# Patient Record
Sex: Female | Born: 1980
Health system: Southern US, Community
[De-identification: ages and names within clinical notes are randomized; demographics above are authoritative.]

## PROBLEM LIST (undated history)

## (undated) DIAGNOSIS — I10 Essential (primary) hypertension: Secondary | ICD-10-CM

## (undated) DIAGNOSIS — E282 Polycystic ovarian syndrome: Secondary | ICD-10-CM

## (undated) DIAGNOSIS — D649 Anemia, unspecified: Secondary | ICD-10-CM

## (undated) DIAGNOSIS — E611 Iron deficiency: Secondary | ICD-10-CM

## (undated) HISTORY — DX: Anemia, unspecified: D64.9

## (undated) HISTORY — PX: TUBAL LIGATION: SHX77

## (undated) HISTORY — DX: Iron deficiency: E61.1

## (undated) HISTORY — PX: CHOLECYSTECTOMY: SHX55

---

## 2017-03-28 ENCOUNTER — Other Ambulatory Visit: Payer: Self-pay

## 2017-03-28 ENCOUNTER — Emergency Department (HOSPITAL_COMMUNITY)
Admission: EM | Admit: 2017-03-28 | Discharge: 2017-03-28 | Disposition: A | Discharge status: Home / Self Care | Payer: 59 | Attending: Emergency Medicine | Admitting: Emergency Medicine

## 2017-03-28 ENCOUNTER — Encounter (HOSPITAL_COMMUNITY): Payer: Self-pay | Admitting: Emergency Medicine

## 2017-03-28 DIAGNOSIS — R509 Fever, unspecified: Secondary | ICD-10-CM | POA: Insufficient documentation

## 2017-03-28 DIAGNOSIS — R1030 Lower abdominal pain, unspecified: Secondary | ICD-10-CM | POA: Insufficient documentation

## 2017-03-28 DIAGNOSIS — I10 Essential (primary) hypertension: Secondary | ICD-10-CM | POA: Diagnosis not present

## 2017-03-28 DIAGNOSIS — R52 Pain, unspecified: Secondary | ICD-10-CM

## 2017-03-28 DIAGNOSIS — Z79899 Other long term (current) drug therapy: Secondary | ICD-10-CM | POA: Diagnosis not present

## 2017-03-28 HISTORY — DX: Essential (primary) hypertension: I10

## 2017-03-28 LAB — COMPREHENSIVE METABOLIC PANEL
ALBUMIN: 4.2 g/dL (ref 3.5–5.0)
ALK PHOS: 64 U/L (ref 38–126)
ALT: 20 U/L (ref 14–54)
AST: 31 U/L (ref 15–41)
Anion gap: 8 (ref 5–15)
BILIRUBIN TOTAL: 1 mg/dL (ref 0.3–1.2)
BUN: 8 mg/dL (ref 6–20)
CALCIUM: 8.6 mg/dL — AB (ref 8.9–10.3)
CO2: 22 mmol/L (ref 22–32)
CREATININE: 0.96 mg/dL (ref 0.44–1.00)
Chloride: 104 mmol/L (ref 101–111)
GFR calc Af Amer: >60 mL/min (ref >60)
GFR calc non Af Amer: >60 mL/min (ref >60)
GLUCOSE: 125 mg/dL — AB (ref 65–99)
Potassium: 3.6 mmol/L (ref 3.5–5.1)
SODIUM: 134 mmol/L — AB (ref 135–145)
TOTAL PROTEIN: 7.7 g/dL (ref 6.5–8.1)

## 2017-03-28 LAB — LIPASE, BLOOD: Lipase: 25 U/L (ref 11–51)

## 2017-03-28 LAB — WET PREP, GENITAL
Clue Cells Wet Prep HPF POC: NONE SEEN
Sperm: NONE SEEN
Trich, Wet Prep: NONE SEEN
Yeast Wet Prep HPF POC: NONE SEEN

## 2017-03-28 LAB — INFLUENZA PANEL BY PCR (TYPE A & B)
Influenza A By PCR: NEGATIVE
Influenza B By PCR: NEGATIVE

## 2017-03-28 LAB — CBC
HCT: 38.3 % (ref 36.0–46.0)
Hemoglobin: 13 g/dL (ref 12.0–15.0)
MCH: 27.1 pg (ref 26.0–34.0)
MCHC: 33.9 g/dL (ref 30.0–36.0)
MCV: 79.8 fL (ref 78.0–100.0)
PLATELETS: 265 10*3/uL (ref 150–400)
RBC: 4.8 MIL/uL (ref 3.87–5.11)
RDW: 14.8 % (ref 11.5–15.5)
WBC: 17.5 10*3/uL — ABNORMAL HIGH (ref 4.0–10.5)

## 2017-03-28 LAB — URINALYSIS, ROUTINE W REFLEX MICROSCOPIC
Bacteria, UA: NONE SEEN
Bilirubin Urine: NEGATIVE
GLUCOSE, UA: NEGATIVE mg/dL
Hgb urine dipstick: NEGATIVE
Ketones, ur: NEGATIVE mg/dL
Leukocytes, UA: NEGATIVE
NITRITE: NEGATIVE
PH: 5 (ref 5.0–8.0)
PROTEIN: 30 mg/dL — AB
Specific Gravity, Urine: 1.019 (ref 1.005–1.030)

## 2017-03-28 LAB — I-STAT BETA HCG BLOOD, ED (MC, WL, AP ONLY): I-stat hCG, quantitative: <5 m[IU]/mL (ref <5)

## 2017-03-28 LAB — I-STAT CG4 LACTIC ACID, ED
LACTIC ACID, VENOUS: 3.24 mmol/L — AB (ref 0.5–1.9)
LACTIC ACID, VENOUS: 1.41 mmol/L (ref 0.5–1.9)

## 2017-03-28 MED ORDER — DEXTROSE 5 % IV SOLN
1.0000 g | Freq: Once | INTRAVENOUS | Status: AC
  Administered 2017-03-28: 1 g via INTRAVENOUS
  Filled 2017-03-28: qty 10

## 2017-03-28 MED ORDER — KETOROLAC TROMETHAMINE 30 MG/ML IJ SOLN
30.0000 mg | Freq: Once | INTRAMUSCULAR | Status: AC
  Administered 2017-03-28: 30 mg via INTRAVENOUS
  Filled 2017-03-28: qty 1

## 2017-03-28 MED ORDER — HYDRALAZINE HCL 20 MG/ML IJ SOLN
5.0000 mg | Freq: Once | INTRAMUSCULAR | Status: AC
  Administered 2017-03-28: 5 mg via INTRAVENOUS
  Filled 2017-03-28: qty 1

## 2017-03-28 MED ORDER — ACETAMINOPHEN 325 MG PO TABS
650.0000 mg | ORAL_TABLET | Freq: Once | ORAL | Status: AC
  Administered 2017-03-28: 650 mg via ORAL
  Filled 2017-03-28: qty 2

## 2017-03-28 MED ORDER — SODIUM CHLORIDE 0.9 % IV BOLUS (SEPSIS)
1000.0000 mL | Freq: Once | INTRAVENOUS | Status: AC
  Administered 2017-03-28: 1000 mL via INTRAVENOUS

## 2017-03-28 MED ORDER — ACETAMINOPHEN 325 MG PO TABS
650.0000 mg | ORAL_TABLET | Freq: Once | ORAL | Status: AC
Start: 2017-03-28 — End: 2017-03-28
  Administered 2017-03-28: 650 mg via ORAL
  Filled 2017-03-28: qty 2

## 2017-03-28 MED ORDER — SODIUM CHLORIDE 0.9 % IV SOLN
1000.0000 mL | INTRAVENOUS | Status: DC
  Administered 2017-03-28: 1000 mL via INTRAVENOUS

## 2017-03-28 MED ORDER — ONDANSETRON 4 MG PO TBDP
4.0000 mg | ORAL_TABLET | Freq: Once | ORAL | Status: AC | PRN
  Administered 2017-03-28: 4 mg via ORAL
  Filled 2017-03-28: qty 1

## 2017-03-28 MED ORDER — AZITHROMYCIN 250 MG PO TABS
1000.0000 mg | ORAL_TABLET | Freq: Once | ORAL | Status: AC
  Administered 2017-03-28: 1000 mg via ORAL
  Filled 2017-03-28: qty 4

## 2017-03-28 NOTE — Discharge Instructions (Signed)
1.  Finish the antibiotics as prescribed by your primary care doctor. 2.  You were given an IV dose of Rocephin and a dose of Zithromax in the emergency department. 3.  Take ibuprofen and Tylenol for fever and body aches. 4.  Continue taking her metoprolol as prescribed.  Resume taking your nifedipine as prescribed. 5.  See your family doctor on Monday.  Return to the emergency department if your symptoms are not improving, worsening or new concerning symptoms develop.

## 2017-03-28 NOTE — ED Notes (Signed)
Bed: WA19 Expected date:  Expected time:  Means of arrival:  Comments: Hold for triage 4 

## 2017-03-28 NOTE — ED Provider Notes (Signed)
[facility]-EMERGENCY DEPT Provider Note   CSN: [id number] Arrival date & time: 03/28/17  4098     History   Chief Complaint Chief Complaint  Patient presents with  . Abdominal Pain  . Chills  . Headache    HPI Annette Ford is a 37 y.o. female.  HPI Since first symptoms started Monday, 4 days ago.  Initially she was noting lower abdominal discomfort and pain.  It was central with aching quality.  She denied pain into her flanks.  Denies vaginal discharge.  No noted pain burning or urgency.  She then went on to feel more generally ill.  She reports she got achy and developed nausea.  She saw her medical doctor yesterday and was diagnosed with UTI.  She was given Bactrim of which she took 1 dose last night.  She reports she is gotten a lot more generalized symptoms now.  She reports her whole body hurts, both arms and both legs feel very painful and achy.  She reports she did vomit twice this morning.  She reports she now has a bad frontal headache.,denies nasal discharge, earache or sore throat, neck pain or stiffness.  No cough.  Patient has history of hypertension.  She took her medications this morning but missed doses yesterday.  She is also self discontinued nifedipine and triamterene hydrochlorothiazide a number of months ago because she reports they were making her blood pressure too low.  She reports when she has a frontal headache like this it is often due to her hypertension.  She reports her blood pressures have routinely now been running moderately elevated. Past Medical History:  Diagnosis Date  . Hypertension     There are no active problems to display for this patient.   Past Surgical History:  Procedure Laterality Date  . CESAREAN SECTION    . CHOLECYSTECTOMY      OB History    No data available       Home Medications    Prior to Admission medications   Medication Sig Start Date End Date Taking? Authorizing Provider    acetaminophen (TYLENOL) 500 MG tablet Take 1,000 mg by mouth every 8 (eight) hours as needed for mild pain or headache.   Yes [provider]  metoprolol tartrate (LOPRESSOR) 100 MG tablet Take 100 mg by mouth 2 (two) times daily.   Yes [provider]  sulfamethoxazole-trimethoprim (BACTRIM DS,SEPTRA DS) 800-160 MG tablet Take 1 tablet by mouth 2 (two) times daily. 03/27/17  Yes [provider]    Family History History reviewed. No pertinent family history.  Social History Social History   Tobacco Use  . Smoking status: Never Smoker  . Smokeless tobacco: Never Used  Substance Use Topics  . Alcohol use: No    Frequency: Never  . Drug use: No     Allergies   Patient has no known allergies.   Review of Systems Review of Systems 10 Systems reviewed and are negative for acute change except as noted in the HPI.   Physical Exam Updated Vital Signs BP (!) 161/104 (BP Location: Right Arm)   Pulse (!) 112   Temp 98.3 F (36.8 C) (Oral)   Resp 14   LMP 03/15/2017 (Exact Date)   SpO2 99%   Physical Exam  Constitutional: She is oriented to person, place, and time.  Patient is alert and nontoxic but mildly ill in appearance.  Mental status is clear.  No respiratory distress.  HENT:  Head: Normocephalic and  atraumatic.  Bilateral TMs normal.  Right TM somewhat obstructed by cerumen but visualized portion nonerythematous.  Posterior oropharynx is widely patent.  No erythema exudates or vesicles.  Eyes: EOM are normal. Pupils are equal, round, and reactive to light.  Conjunctivae are mildly injected bilaterally.  Neck: Neck supple.  No meningismus.  Cardiovascular: Normal rate, regular rhythm, normal heart sounds and intact distal pulses.  Pulmonary/Chest: Effort normal and breath sounds normal.  Abdominal: Soft. She exhibits no distension. There is tenderness.  Mild lower abdominal tenderness suprapubic into the left.  No guarding.  Genitourinary:   Genitourinary Comments: Normal external female genitalia.  Speculum examination mild amount of yellowish discharge from cervical os.  Slight friability.  Bimanual, patient denies significant tenderness to bimanual exam.  Musculoskeletal: Normal range of motion. She exhibits tenderness. She exhibits no edema.  No peripheral edema.  Extremities are well formed and symmetric.  Patient does endorse diffuse discomfort to palpation of any muscle bodies.  Neurological: She is alert and oriented to person, place, and time. No cranial nerve deficit. She exhibits normal muscle tone. Coordination normal.  Skin: Skin is warm and dry. No rash noted.  Psychiatric: She has a normal mood and affect.     ED Treatments / Results  Labs (all labs ordered are listed, but only abnormal results are displayed) Labs Reviewed  WET PREP, GENITAL - Abnormal; Notable for the following components:      Result Value   WBC, Wet Prep HPF POC MODERATE (*)    All other components within normal limits  COMPREHENSIVE METABOLIC PANEL - Abnormal; Notable for the following components:   Sodium 134 (*)    Glucose, Bld 125 (*)    Calcium 8.6 (*)    All other components within normal limits  CBC - Abnormal; Notable for the following components:   WBC 17.5 (*)    All other components within normal limits  URINALYSIS, ROUTINE W REFLEX MICROSCOPIC - Abnormal; Notable for the following components:   Protein, ur 30 (*)    Squamous Epithelial / LPF 0-5 (*)    All other components within normal limits  I-STAT CG4 LACTIC ACID, ED - Abnormal; Notable for the following components:   Lactic Acid, Venous 3.24 (*)    All other components within normal limits  LIPASE, BLOOD  INFLUENZA PANEL BY PCR (TYPE A & B)  I-STAT BETA HCG BLOOD, ED ([facility], [facility], [facility] ONLY)  I-STAT CG4 LACTIC ACID, ED  GC/CHLAMYDIA PROBE AMP ([place]) NOT AT [facility]    EKG  EKG Interpretation None       Radiology No results found.  Procedures Procedures  (including critical care time)  Medications Ordered in ED Medications  sodium chloride 0.9 % bolus 1,000 mL (0 mLs Intravenous Stopped 03/28/17 1247)    Followed by  0.9 %  sodium chloride infusion (1,000 mLs Intravenous New Bag/Given 03/28/17 1138)  cefTRIAXone (ROCEPHIN) 1 g in dextrose 5 % 50 mL IVPB (not administered)  azithromycin (ZITHROMAX) tablet 1,000 mg (not administered)  hydrALAZINE (APRESOLINE) injection 5 mg (not administered)  ondansetron (ZOFRAN-ODT) disintegrating tablet 4 mg (4 mg Oral Given 03/28/17 1012)  acetaminophen (TYLENOL) tablet 650 mg (650 mg Oral Given 03/28/17 1026)  ketorolac (TORADOL) 30 MG/ML injection 30 mg (30 mg Intravenous Given 03/28/17 1138)  sodium chloride 0.9 % bolus 1,000 mL (1,000 mLs Intravenous New Bag/Given 03/28/17 1420)  acetaminophen (TYLENOL) tablet 650 mg (650 mg Oral Given 03/28/17 1421)     Initial Impression / Assessment  and Plan / ED Course  I have reviewed the triage vital signs and the nursing notes.  Pertinent labs & imaging results that were available during my care of the patient were reviewed by me and considered in my medical decision making (see chart for details).      Final Clinical Impressions(s) / ED Diagnoses   Final diagnoses:  Fever, unspecified fever cause  Lower abdominal pain  Generalized pain  Essential hypertension   Patient presents with fever and leukocytosis.  Symptoms onset first with lower abdominal discomfort.  Patient was started on antibiotics yesterday by PCP for UTI.  Today's urinalysis test negative.  Patient's abdominal exam is for fairly mild nonlocalizing suprapubic discomfort.  Abdominal exam is nonsurgical.  She has developed generalized symptoms of myalgia in both upper and lower extremities as well as frontal headache.  Patient attributes a headache to her slightly elevated blood pressure.  She does not have meningismus or neck pain.  Low suspicion for meningitic process.  At this time, based on first  symptoms being pelvic in etiology will add IV dose of Rocephin and dose of Zithromax in the emergency department while awaiting cultures.  Patient will continue the Bactrim as prescribed by her PCP.  She is counseled to resume her nifedipine as prescribed.  She will take ibuprofen and acetaminophen for fever and pain control.  She is counseled to return if symptoms are not improving or any additional concerning symptoms develop. ED Discharge Orders    None       Arby Barrette, MD 03/28/17 [phone]

## 2017-03-28 NOTE — ED Triage Notes (Signed)
Pt c/o lower abdominal pain, headache and chills, pt states she was seen in MD's office yesterday, dx with UTI and given antibiotics. Pt states she has only taken one pill and rx bid. Pt also c/o nausea, denies diarrhea.

## 2017-03-31 LAB — GC/CHLAMYDIA PROBE AMP (~~LOC~~) NOT AT ARMC
CHLAMYDIA, DNA PROBE: NEGATIVE
Neisseria Gonorrhea: NEGATIVE

## 2017-07-01 ENCOUNTER — Emergency Department (HOSPITAL_BASED_OUTPATIENT_CLINIC_OR_DEPARTMENT_OTHER)
Admission: EM | Admit: 2017-07-01 | Discharge: 2017-07-01 | Disposition: A | Discharge status: Home / Self Care | Payer: No Typology Code available for payment source | Attending: Physician Assistant | Admitting: Physician Assistant

## 2017-07-01 ENCOUNTER — Encounter (HOSPITAL_BASED_OUTPATIENT_CLINIC_OR_DEPARTMENT_OTHER): Payer: Self-pay | Admitting: *Deleted

## 2017-07-01 ENCOUNTER — Other Ambulatory Visit: Payer: Self-pay

## 2017-07-01 DIAGNOSIS — Y939 Activity, unspecified: Secondary | ICD-10-CM | POA: Insufficient documentation

## 2017-07-01 DIAGNOSIS — Z79899 Other long term (current) drug therapy: Secondary | ICD-10-CM | POA: Diagnosis not present

## 2017-07-01 DIAGNOSIS — Y999 Unspecified external cause status: Secondary | ICD-10-CM | POA: Insufficient documentation

## 2017-07-01 DIAGNOSIS — S39012A Strain of muscle, fascia and tendon of lower back, initial encounter: Secondary | ICD-10-CM | POA: Diagnosis not present

## 2017-07-01 DIAGNOSIS — Y929 Unspecified place or not applicable: Secondary | ICD-10-CM | POA: Diagnosis not present

## 2017-07-01 DIAGNOSIS — S3992XA Unspecified injury of lower back, initial encounter: Secondary | ICD-10-CM | POA: Diagnosis present

## 2017-07-01 DIAGNOSIS — S161XXA Strain of muscle, fascia and tendon at neck level, initial encounter: Secondary | ICD-10-CM

## 2017-07-01 DIAGNOSIS — M62838 Other muscle spasm: Secondary | ICD-10-CM

## 2017-07-01 DIAGNOSIS — M542 Cervicalgia: Secondary | ICD-10-CM

## 2017-07-01 DIAGNOSIS — M545 Low back pain, unspecified: Secondary | ICD-10-CM

## 2017-07-01 DIAGNOSIS — I10 Essential (primary) hypertension: Secondary | ICD-10-CM | POA: Diagnosis not present

## 2017-07-01 LAB — PREGNANCY, URINE: Preg Test, Ur: NEGATIVE

## 2017-07-01 MED ORDER — NAPROXEN 500 MG PO TABS: 500.0000 mg | ORAL_TABLET | Freq: Two times a day (BID) | ORAL | 0 refills | Status: DC | PRN

## 2017-07-01 MED ORDER — CYCLOBENZAPRINE HCL 10 MG PO TABS: 10.0000 mg | ORAL_TABLET | Freq: Three times a day (TID) | ORAL | 0 refills | Status: DC | PRN

## 2017-07-01 MED FILL — CYCLOBENZAPRINE HCL 10 MG T: 10 | 5 days supply | Qty: 15 | Fill #0

## 2017-07-01 MED FILL — NAPROXEN 500 MG TABLET: 500 | 10 days supply | Qty: 20 | Fill #0
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
## 2017-07-01 NOTE — Discharge Instructions (Addendum)
Take naprosyn as directed for inflammation and pain (take on a schedule, twice daily with food, for the next 2-3 days, then as needed thereafter), with tylenol for breakthrough pain and flexeril for muscle relaxation. Do not drive or operate machinery with muscle relaxant use. Ice to areas of soreness for the next 24 hours and then may move to heat, no more than 20 minutes at a time every hour for each. Expect to be sore for the next few days and follow up with primary care physician for recheck of ongoing symptoms in the next 1-2 weeks. Return to ER for emergent changing or worsening of symptoms.

## 2017-07-01 NOTE — ED Notes (Signed)
NAD at this time. Pt is stable and going home.  
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
## 2017-07-02 ENCOUNTER — Emergency Department (HOSPITAL_BASED_OUTPATIENT_CLINIC_OR_DEPARTMENT_OTHER)
Admission: EM | Admit: 2017-07-02 | Discharge: 2017-07-02 | Disposition: A | Discharge status: Home / Self Care | Payer: No Typology Code available for payment source | Attending: Emergency Medicine | Admitting: Emergency Medicine

## 2017-07-02 ENCOUNTER — Other Ambulatory Visit: Payer: Self-pay

## 2017-07-02 ENCOUNTER — Encounter (HOSPITAL_BASED_OUTPATIENT_CLINIC_OR_DEPARTMENT_OTHER): Payer: Self-pay

## 2017-07-02 DIAGNOSIS — M25552 Pain in left hip: Secondary | ICD-10-CM | POA: Insufficient documentation

## 2017-07-02 DIAGNOSIS — I10 Essential (primary) hypertension: Secondary | ICD-10-CM | POA: Insufficient documentation

## 2017-07-02 DIAGNOSIS — Y92410 Unspecified street and highway as the place of occurrence of the external cause: Secondary | ICD-10-CM | POA: Diagnosis not present

## 2017-07-02 DIAGNOSIS — S3981XA Other specified injuries of abdomen, initial encounter: Secondary | ICD-10-CM | POA: Diagnosis not present

## 2017-07-02 DIAGNOSIS — M79652 Pain in left thigh: Secondary | ICD-10-CM | POA: Diagnosis not present

## 2017-07-02 DIAGNOSIS — Y998 Other external cause status: Secondary | ICD-10-CM | POA: Diagnosis not present

## 2017-07-02 DIAGNOSIS — Y9389 Activity, other specified: Secondary | ICD-10-CM | POA: Diagnosis not present

## 2017-07-02 DIAGNOSIS — Z79899 Other long term (current) drug therapy: Secondary | ICD-10-CM | POA: Insufficient documentation

## 2017-07-02 DIAGNOSIS — S3991XA Unspecified injury of abdomen, initial encounter: Secondary | ICD-10-CM | POA: Diagnosis present

## 2017-07-02 LAB — URINALYSIS, MICROSCOPIC (REFLEX)

## 2017-07-02 LAB — URINALYSIS, ROUTINE W REFLEX MICROSCOPIC
Bilirubin Urine: NEGATIVE
Glucose, UA: NEGATIVE mg/dL
HGB URINE DIPSTICK: NEGATIVE
KETONES UR: 15 mg/dL — AB
Nitrite: NEGATIVE
PROTEIN: 30 mg/dL — AB
Specific Gravity, Urine: >1.03 — ABNORMAL HIGH (ref 1.005–1.030)
pH: 6 (ref 5.0–8.0)

## 2017-07-02 LAB — PREGNANCY, URINE: PREG TEST UR: NEGATIVE
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
## 2017-07-02 NOTE — ED Notes (Addendum)
Patient states that she has not taken any flexeril today due to having to drive earlier today. States last dose of naproxen 1240 today with noted relief until 1600 today. Patient states ate while in waiting room;aware of NPO states until instructed otherwise.  NAD noted.
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
## 2017-07-04 ENCOUNTER — Emergency Department (HOSPITAL_BASED_OUTPATIENT_CLINIC_OR_DEPARTMENT_OTHER)
Admission: EM | Admit: 2017-07-04 | Discharge: 2017-07-04 | Disposition: A | Discharge status: Home / Self Care | Payer: 59 | Attending: Emergency Medicine | Admitting: Emergency Medicine

## 2017-07-04 ENCOUNTER — Other Ambulatory Visit: Payer: Self-pay

## 2017-07-04 ENCOUNTER — Encounter (HOSPITAL_BASED_OUTPATIENT_CLINIC_OR_DEPARTMENT_OTHER): Payer: Self-pay | Admitting: Emergency Medicine

## 2017-07-04 DIAGNOSIS — R112 Nausea with vomiting, unspecified: Secondary | ICD-10-CM | POA: Insufficient documentation

## 2017-07-04 DIAGNOSIS — Z9049 Acquired absence of other specified parts of digestive tract: Secondary | ICD-10-CM | POA: Diagnosis not present

## 2017-07-04 DIAGNOSIS — I10 Essential (primary) hypertension: Secondary | ICD-10-CM | POA: Diagnosis not present

## 2017-07-04 DIAGNOSIS — Z79899 Other long term (current) drug therapy: Secondary | ICD-10-CM | POA: Diagnosis not present

## 2017-07-04 HISTORY — DX: Polycystic ovarian syndrome: E28.2

## 2017-07-04 MED ORDER — ONDANSETRON 4 MG PO TBDP
4.0000 mg | ORAL_TABLET | Freq: Once | ORAL | Status: AC
  Administered 2017-07-04: 4 mg via ORAL

## 2017-07-04 MED ORDER — ONDANSETRON 4 MG PO TBDP: 4.0000 mg | ORAL_TABLET | Freq: Three times a day (TID) | ORAL | 0 refills | Status: DC | PRN

## 2017-07-04 MED ORDER — ONDANSETRON 4 MG PO TBDP
ORAL_TABLET | ORAL | Status: AC
  Administered 2017-07-04: 4 mg via ORAL
  Filled 2017-07-04: qty 1

## 2017-07-04 MED FILL — ONDANSETRON ODT 4 MG TABLET: 4 | 4 days supply | Qty: 12 | Fill #0
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
## 2017-07-04 NOTE — Discharge Instructions (Signed)
Return to the ED with any concerns including vomiting and not able to keep down liquids or your medications, abdominal pain especially if it localizes to the right lower abdomen, fever or chills, and decreased urine output, decreased level of alertness or lethargy, or any other alarming symptoms.  °

## 2018-03-05 DIAGNOSIS — E66811 Obesity, class 1: Secondary | ICD-10-CM | POA: Insufficient documentation

## 2018-03-05 DIAGNOSIS — O30009 Twin pregnancy, unspecified number of placenta and unspecified number of amniotic sacs, unspecified trimester: Secondary | ICD-10-CM | POA: Insufficient documentation

## 2018-03-05 DIAGNOSIS — Z8759 Personal history of other complications of pregnancy, childbirth and the puerperium: Secondary | ICD-10-CM | POA: Insufficient documentation

## 2018-03-05 DIAGNOSIS — E669 Obesity, unspecified: Secondary | ICD-10-CM | POA: Insufficient documentation

## 2018-04-08 DIAGNOSIS — B009 Herpesviral infection, unspecified: Secondary | ICD-10-CM | POA: Insufficient documentation

## 2018-08-28 DIAGNOSIS — Z98891 History of uterine scar from previous surgery: Secondary | ICD-10-CM | POA: Insufficient documentation

## 2018-12-22 ENCOUNTER — Encounter (HOSPITAL_COMMUNITY): Payer: Self-pay

## 2018-12-22 ENCOUNTER — Other Ambulatory Visit: Payer: Self-pay

## 2018-12-22 ENCOUNTER — Ambulatory Visit (HOSPITAL_COMMUNITY)
Admission: EM | Admit: 2018-12-22 | Discharge: 2018-12-22 | Disposition: A | Discharge status: Home / Self Care | Payer: Commercial Managed Care - PPO | Attending: Emergency Medicine | Admitting: Emergency Medicine

## 2018-12-22 DIAGNOSIS — I10 Essential (primary) hypertension: Secondary | ICD-10-CM | POA: Insufficient documentation

## 2018-12-22 DIAGNOSIS — R300 Dysuria: Secondary | ICD-10-CM

## 2018-12-22 LAB — POCT URINALYSIS DIP (DEVICE)
Bilirubin Urine: NEGATIVE
Glucose, UA: NEGATIVE mg/dL
Hgb urine dipstick: NEGATIVE
Ketones, ur: NEGATIVE mg/dL
Nitrite: NEGATIVE
Protein, ur: NEGATIVE mg/dL
Specific Gravity, Urine: 1.02 (ref 1.005–1.030)
Urobilinogen, UA: 0.2 mg/dL (ref 0.0–1.0)
pH: 6 (ref 5.0–8.0)

## 2018-12-22 MED ORDER — CEPHALEXIN 500 MG PO CAPS: 500.0000 mg | ORAL_CAPSULE | Freq: Two times a day (BID) | ORAL | 0 refills | Status: AC

## 2018-12-22 NOTE — ED Provider Notes (Signed)
[facility]    CSN: [id number] Arrival date & time: 12/22/18  1423      History   Chief Complaint Chief Complaint  Patient presents with  . Urinary Tract Infection    HPI Annette Ford is a 38 y.o. female.   Patient presents with dysuria, urinary frequency, left flank pain x1 week.  She denies fever, chills, abdominal pain, vaginal discharge, pelvic pain, or other symptoms.  Medical history is significant for hypertension and patient states she has not been taking her blood pressure medications.  She denies dizziness, palpitations, chest pain, shortness of breath, weakness, or other symptoms.  LMP: 12/18/2018  The history is provided by the patient.    Past Medical History:  Diagnosis Date  . Hypertension   . PCOS (polycystic ovarian syndrome)     There are no active problems to display for this patient.   Past Surgical History:  Procedure Laterality Date  . CESAREAN SECTION    . CHOLECYSTECTOMY      OB History   No obstetric history on file.      Home Medications    Prior to Admission medications   Medication Sig Start Date End Date Taking? Authorizing Provider  acetaminophen (TYLENOL) 500 MG tablet Take 1,000 mg by mouth every 8 (eight) hours as needed for mild pain or headache.    [provider]  cephALEXin (KEFLEX) 500 MG capsule Take 1 capsule (500 mg total) by mouth 2 (two) times daily for 5 days. 12/22/18 12/27/18  Mickie Bailate, Chong January H, NP  cyclobenzaprine (FLEXERIL) 10 MG tablet Take 1 tablet (10 mg total) by mouth 3 (three) times daily as needed for muscle spasms. 07/01/17   Street, [place], PA-C  metoprolol tartrate (LOPRESSOR) 100 MG tablet Take 100 mg by mouth 2 (two) times daily.    [provider]  naproxen (NAPROSYN) 500 MG tablet Take 1 tablet (500 mg total) by mouth 2 (two) times daily as needed for mild pain, moderate pain or headache (TAKE WITH MEALS.). 07/01/17   Street, [place], PA-C  NIFEDIPINE PO Take by mouth.     [provider]  ondansetron (ZOFRAN ODT) 4 MG disintegrating tablet Take 1 tablet (4 mg total) by mouth every 8 (eight) hours as needed for nausea or vomiting. 07/04/17   Mabe, Latanya MaudlinMartha L, MD    Family History No family history on file.  Social History Social History   Tobacco Use  . Smoking status: Never Smoker  . Smokeless tobacco: Never Used  Substance Use Topics  . Alcohol use: Yes    Frequency: Never  . Drug use: No     Allergies   Patient has no known allergies.   Review of Systems Review of Systems  Constitutional: Negative for chills and fever.  HENT: Negative for ear pain and sore throat.   Eyes: Negative for pain and visual disturbance.  Respiratory: Negative for cough and shortness of breath.   Cardiovascular: Negative for chest pain and palpitations.  Gastrointestinal: Negative for abdominal pain and vomiting.  Genitourinary: Positive for dysuria, flank pain and frequency. Negative for hematuria, pelvic pain and vaginal discharge.  Musculoskeletal: Negative for arthralgias and back pain.  Skin: Negative for color change and rash.  Neurological: Negative for seizures and syncope.  All other systems reviewed and are negative.    Physical Exam Triage Vital Signs ED Triage Vitals  Enc Vitals Group     BP 12/22/18 1443 (!) 192/117     Pulse Rate 12/22/18 1443 96  Resp 12/22/18 1443 18     Temp 12/22/18 1443 98.2 F (36.8 C)     Temp Source 12/22/18 1443 Oral     SpO2 12/22/18 1443 100 %     Weight 12/22/18 1441 180 lb (81.6 kg)     Height --      Head Circumference --      Peak Flow --      Pain Score 12/22/18 1441 5     Pain Loc --      Pain Edu? --      Excl. in [place]? --    No data found.  Updated Vital Signs BP (!) 173/104 (BP Location: Left Arm)   Pulse 85   Temp 98.2 F (36.8 C) (Oral)   Resp 18   Wt 180 lb (81.6 kg)   LMP 12/18/2018   SpO2 100%   BMI 29.95 kg/m   Visual Acuity Right Eye Distance:   Left Eye Distance:    Bilateral Distance:    Right Eye Near:   Left Eye Near:    Bilateral Near:     Physical Exam Vitals signs and nursing note reviewed.  Constitutional:      General: She is not in acute distress.    Appearance: She is well-developed.  HENT:     Head: Normocephalic and atraumatic.     Mouth/Throat:     Mouth: Mucous membranes are moist.  Eyes:     Conjunctiva/sclera: Conjunctivae normal.  Neck:     Musculoskeletal: Neck supple.  Cardiovascular:     Rate and Rhythm: Normal rate and regular rhythm.     Heart sounds: No murmur.  Pulmonary:     Effort: Pulmonary effort is normal. No respiratory distress.     Breath sounds: Normal breath sounds.  Abdominal:     General: Bowel sounds are normal.     Palpations: Abdomen is soft.     Tenderness: There is no abdominal tenderness. There is no right CVA tenderness, left CVA tenderness, guarding or rebound.  Skin:    General: Skin is warm and dry.  Neurological:     General: No focal deficit present.     Mental Status: She is alert and oriented to person, place, and time.      UC Treatments / Results  Labs (all labs ordered are listed, but only abnormal results are displayed) Labs Reviewed  POCT URINALYSIS DIP (DEVICE) - Abnormal; Notable for the following components:      Result Value   Leukocytes,Ua TRACE (*)    All other components within normal limits  URINE CULTURE    EKG   Radiology No results found.  Procedures Procedures (including critical care time)  Medications Ordered in UC Medications - No data to display  Initial Impression / Assessment and Plan / UC Course  I have reviewed the triage vital signs and the nursing notes.  Pertinent labs & imaging results that were available during my care of the patient were reviewed by me and considered in my medical decision making (see chart for details).    Dysuria.  Elevated blood pressure with known hypertension.  Urine dip positive for LE.  Urine culture  pending.  Discussed with patient that her blood pressure is elevated today.  She is noncompliant with her blood pressure medication.  Discussed that she should have this rechecked by her primary care provider in the next week and that she should take her medication as prescribed.  Discussed her risk for cardiovascular events  due to hypertension.  Patient agrees to plan of care.     Final Clinical Impressions(s) / UC Diagnoses   Final diagnoses:  Dysuria  Elevated blood pressure reading in office with diagnosis of hypertension     Discharge Instructions     Take the antibiotic cephalexin as prescribed.  Your urine shows signs of a possible infection.  A urine culture is pending.    Your blood pressure is elevated today at  192/117 and rechecked at 173/104.  Please have this rechecked by your primary care provider in one week.  Take your blood pressure medications as directed by your primary care provider.        ED Prescriptions    Medication Sig Dispense Auth. Provider   cephALEXin (KEFLEX) 500 MG capsule Take 1 capsule (500 mg total) by mouth 2 (two) times daily for 5 days. 10 capsule Mickie Bail, NP     PDMP not reviewed this encounter.   Mickie Bail, NP 12/22/18 [id number]

## 2018-12-22 NOTE — ED Triage Notes (Signed)
PT states the she has pressure when trying to void. X 1 week or more.

## 2018-12-22 NOTE — Discharge Instructions (Signed)
Take the antibiotic cephalexin as prescribed.  Your urine shows signs of a possible infection.  A urine culture is pending.    Your blood pressure is elevated today at  192/117 and rechecked at 173/104.  Please have this rechecked by your primary care provider in one week.  Take your blood pressure medications as directed by your primary care provider.

## 2018-12-25 ENCOUNTER — Telehealth (HOSPITAL_COMMUNITY): Payer: Self-pay | Admitting: Emergency Medicine

## 2018-12-25 LAB — URINE CULTURE: Culture: 10000 — AB

## 2018-12-25 NOTE — Telephone Encounter (Signed)
Only 10,000 colonies, pt was given keflex for treatment. Patient contacted and made aware of  Culture results, informed to please follow up if still having symptoms since colony count was low.

## 2019-03-24 ENCOUNTER — Emergency Department (HOSPITAL_COMMUNITY): Payer: Commercial Managed Care - PPO

## 2019-03-24 ENCOUNTER — Emergency Department (HOSPITAL_COMMUNITY)
Admission: EM | Admit: 2019-03-24 | Discharge: 2019-03-24 | Disposition: A | Discharge status: Home / Self Care | Payer: Commercial Managed Care - PPO | Attending: Emergency Medicine | Admitting: Emergency Medicine

## 2019-03-24 ENCOUNTER — Other Ambulatory Visit: Payer: Self-pay

## 2019-03-24 ENCOUNTER — Encounter (HOSPITAL_COMMUNITY): Payer: Self-pay | Admitting: Emergency Medicine

## 2019-03-24 DIAGNOSIS — Y999 Unspecified external cause status: Secondary | ICD-10-CM | POA: Insufficient documentation

## 2019-03-24 DIAGNOSIS — Y92009 Unspecified place in unspecified non-institutional (private) residence as the place of occurrence of the external cause: Secondary | ICD-10-CM | POA: Diagnosis not present

## 2019-03-24 DIAGNOSIS — Z79899 Other long term (current) drug therapy: Secondary | ICD-10-CM | POA: Diagnosis not present

## 2019-03-24 DIAGNOSIS — S61012A Laceration without foreign body of left thumb without damage to nail, initial encounter: Secondary | ICD-10-CM | POA: Diagnosis present

## 2019-03-24 DIAGNOSIS — I10 Essential (primary) hypertension: Secondary | ICD-10-CM | POA: Insufficient documentation

## 2019-03-24 DIAGNOSIS — W260XXA Contact with knife, initial encounter: Secondary | ICD-10-CM | POA: Diagnosis not present

## 2019-03-24 DIAGNOSIS — Y9389 Activity, other specified: Secondary | ICD-10-CM | POA: Insufficient documentation

## 2019-03-24 MED ORDER — LIDOCAINE HCL (PF) 1 % IJ SOLN
5.0000 mL | Freq: Once | INTRAMUSCULAR | Status: AC
  Administered 2019-03-24: 5 mL via INTRADERMAL
  Filled 2019-03-24: qty 5

## 2019-03-24 NOTE — ED Triage Notes (Signed)
Patient accidentally cut her left thumb with a knife while opening a toy this evening , approx. 1/2 " laceration with minimal bleeding , pressure dressing applied at triage .

## 2019-03-24 NOTE — ED Provider Notes (Signed)
[facility] EMERGENCY DEPARTMENT Provider Note   CSN: [id number] Arrival date & time: 03/24/19  2013     History Chief Complaint  Patient presents with  . Finger Laceration    Annette Ford is a 38 y.o. female.  The history is provided by the patient and medical records.    38 y.o. F with hx of HTN, PCOS, presenting to the ED for left thumb laceration.  States she was trying to open a toy for one of her children, knife slipped and cut left thumb.  States bleeding was controlled on arrival but started bleeding again once removed.  Tetanus UTD (May 2020).  Of note, patient is hypertensive.  Has not yet taken her PM BP medications.  She has them with her and will take now.  Past Medical History:  Diagnosis Date  . Hypertension   . PCOS (polycystic ovarian syndrome)     There are no problems to display for this patient.   Past Surgical History:  Procedure Laterality Date  . CESAREAN SECTION    . CHOLECYSTECTOMY       OB History   No obstetric history on file.     No family history on file.  Social History   Tobacco Use  . Smoking status: Never Smoker  . Smokeless tobacco: Never Used  Substance Use Topics  . Alcohol use: Yes  . Drug use: No    Home Medications Prior to Admission medications   Medication Sig Start Date End Date Taking? Authorizing Provider  acetaminophen (TYLENOL) 500 MG tablet Take 1,000 mg by mouth every 8 (eight) hours as needed for mild pain or headache.    [provider]  cyclobenzaprine (FLEXERIL) 10 MG tablet Take 1 tablet (10 mg total) by mouth 3 (three) times daily as needed for muscle spasms. 07/01/17   Street, [place], PA-C  metoprolol tartrate (LOPRESSOR) 100 MG tablet Take 100 mg by mouth 2 (two) times daily.    [provider]  naproxen (NAPROSYN) 500 MG tablet Take 1 tablet (500 mg total) by mouth 2 (two) times daily as needed for mild pain, moderate pain or headache (TAKE WITH  MEALS.). 07/01/17   Street, [place], PA-C  NIFEDIPINE PO Take by mouth.    [provider]  ondansetron (ZOFRAN ODT) 4 MG disintegrating tablet Take 1 tablet (4 mg total) by mouth every 8 (eight) hours as needed for nausea or vomiting. 07/04/17   Mabe, Latanya Maudlin, MD    Allergies    Patient has no known allergies.  Review of Systems   Review of Systems  Skin: Positive for wound.  All other systems reviewed and are negative.   Physical Exam Updated Vital Signs BP (!) 175/104 (BP Location: Right Arm)   Pulse 70   Temp 98.4 F (36.9 C) (Oral)   Resp 18   SpO2 100%   Physical Exam Vitals and nursing note reviewed.  Constitutional:      Appearance: She is well-developed.  HENT:     Head: Normocephalic and atraumatic.  Eyes:     Conjunctiva/sclera: Conjunctivae normal.     Pupils: Pupils are equal, round, and reactive to light.  Cardiovascular:     Rate and Rhythm: Normal rate and regular rhythm.     Heart sounds: Normal heart sounds.  Pulmonary:     Effort: Pulmonary effort is normal.     Breath sounds: Normal breath sounds.  Abdominal:     General: Bowel sounds are normal.  Palpations: Abdomen is soft.  Musculoskeletal:        General: Normal range of motion.     Cervical back: Normal range of motion.     Comments: 2cm laceration along medial left thumb, brisk oozing but no pulsatile bleeding noted, able to flex/extend, no bony deformities noted, good cap refill and distal sensation  Skin:    General: Skin is warm and dry.  Neurological:     Mental Status: She is alert and oriented to person, place, and time.     ED Results / Procedures / Treatments   Labs (all labs ordered are listed, but only abnormal results are displayed) Labs Reviewed - No data to display  EKG None  Radiology DG Finger Thumb Left  Result Date: 03/24/2019 CLINICAL DATA:  Laceration of the thumb. EXAM: LEFT THUMB 2+V COMPARISON:  None. FINDINGS: There is no evidence of fracture  or dislocation. There is no evidence of arthropathy or other focal bone abnormality. There is laceration of the first distal phalanx. IMPRESSION: No acute osseous injury of the first distal phalanx. Electronically Signed   By: Kathreen Devoid   On: 03/24/2019 21:22    Procedures Procedures (including critical care time)  LACERATION REPAIR Performed by: Larene Pickett Authorized by: Larene Pickett Consent: Verbal consent obtained. Risks and benefits: risks, benefits and alternatives were discussed Consent given by: patient Patient identity confirmed: provided demographic data Prepped and Draped in normal sterile fashion Wound explored  Laceration Location: medial left thumb  Laceration Length: 2cm  No Foreign Bodies seen or palpated  Anesthesia: local infiltration  Local anesthetic: lidocaine 1% without epinephrine  Anesthetic total: 3 ml  Irrigation method: syringe Amount of cleaning: standard  Skin closure: 4-0 prolene  Number of sutures: 2  Technique: simple interrupted  Patient tolerance: Patient tolerated the procedure well with no immediate complications.   Medications Ordered in ED Medications  lidocaine (PF) (XYLOCAINE) 1 % injection 5 mL (has no administration in time range)    ED Course  I have reviewed the triage vital signs and the nursing notes.  Pertinent labs & imaging results that were available during my care of the patient were reviewed by me and considered in my medical decision making (see chart for details).    MDM Rules/Calculators/A&P                      38 y.o. F here with left thumb laceration from knife while trying to open a toy for her kids.  2cm laceration to left medial thumb, no swelling or bony deformities.  Hand is NVI.  Tetanus UTD.  Laceration repaired as above, tolerated well.  she was initially hypertensive, after her PM home meds BP now 163/95..  Stable for discharge.  Discussed home wound care.  Follow-up with PCP for suture  removal in 1 week.  Return here for any new/acute changes.  Final Clinical Impression(s) / ED Diagnoses Final diagnoses:  Laceration of left thumb without foreign body without damage to nail, initial encounter    Rx / DC Orders ED Discharge Orders    None       Larene Pickett, PA-C 03/24/19 2347    Fatima Blank, MD 03/26/19 [phone]

## 2019-03-24 NOTE — Discharge Instructions (Signed)
Keep wound clean and dry. Follow-up with primary care doctor to have sutures removed in 1 week. Return here for any new/acute changes.

## 2019-06-28 ENCOUNTER — Other Ambulatory Visit: Payer: Self-pay | Admitting: Internal Medicine

## 2019-06-28 ENCOUNTER — Other Ambulatory Visit: Payer: Self-pay

## 2019-06-28 ENCOUNTER — Ambulatory Visit
Admission: RE | Admit: 2019-06-28 | Discharge: 2019-06-28 | Disposition: A | Discharge status: Home / Self Care | Payer: Commercial Managed Care - PPO | Source: Ambulatory Visit | Attending: Internal Medicine | Admitting: Internal Medicine

## 2019-06-28 DIAGNOSIS — M533 Sacrococcygeal disorders, not elsewhere classified: Secondary | ICD-10-CM

## 2020-03-07 ENCOUNTER — Other Ambulatory Visit: Payer: Self-pay

## 2020-03-07 ENCOUNTER — Ambulatory Visit (HOSPITAL_COMMUNITY)
Admission: EM | Admit: 2020-03-07 | Discharge: 2020-03-07 | Disposition: A | Discharge status: Home / Self Care | Payer: Commercial Managed Care - PPO | Attending: Family Medicine | Admitting: Family Medicine

## 2020-03-07 ENCOUNTER — Encounter (HOSPITAL_COMMUNITY): Payer: Self-pay | Admitting: Emergency Medicine

## 2020-03-07 DIAGNOSIS — I10 Essential (primary) hypertension: Secondary | ICD-10-CM

## 2020-03-07 MED ORDER — AMLODIPINE BESYLATE 5 MG PO TABS: 5.0000 mg | ORAL_TABLET | Freq: Every day | ORAL | 2 refills | Status: DC

## 2020-03-07 NOTE — ED Triage Notes (Signed)
Patient was typing and trying to write with right hand and noticed shaking in this hand.  Patient had not eaten, so she ate and shaking improved.  When company nurse returned, patient had her blood pressure checked.  Reports blood pressure 175/101.  No chest pain

## 2020-03-07 NOTE — ED Provider Notes (Signed)
[facility] CARE CENTER   [id number] 03/07/20 Arrival Time: 1439  ASSESSMENT & PLAN:  1. Uncontrolled hypertension     No symptoms of hypertensive urgency. Desires to begin additional anti-hypertensive agent.  Begin: Meds ordered this encounter  Medications  . amLODipine (NORVASC) 5 MG tablet    Sig: Take 1 tablet (5 mg total) by mouth daily.    Dispense:  30 tablet    Refill:  2   Has BP cuff at home to monitor pressures.  Recommend:  Follow-up Information    Schedule an appointment as soon as possible for a visit  with Aliene Beams, MD.   Specialty: Family Medicine Contact information: [address] [place] [address] [phone]        [facility].   Specialty: Urgent Care Why: As needed. Contact information: [address] [place] [address] [phone]              Reviewed expectations re: course of current medical issues. Questions answered. Outlined signs and symptoms indicating need for more acute intervention. Patient verbalized understanding. After Visit Summary given.   SUBJECTIVE:  Annette Ford is a 39 y.o. female who presents with concerns regarding increased blood pressures. Reports that she is treated for hypertension.  She reports taking medications as instructed, no medication side effects noted, no chest pain on exertion, no dyspnea on exertion, no swelling of ankles, no orthostatic dizziness or lightheadedness, no orthopnea or paroxysmal nocturnal dyspnea and no palpitations.  Denies symptoms of chest pain, palpations, orthopnea, nocturnal dyspnea, or LE edema.  Social History   Tobacco Use  Smoking Status Never Smoker  Smokeless Tobacco Never Used    OBJECTIVE:  Vitals:   03/07/20 1612 03/07/20 1614  BP: (!) 161/119 (!) 157/105  Pulse: 78   Resp: 20   Temp: 98.5 F (36.9 C)   TempSrc: Oral   SpO2: 97%     General appearance: alert; no distress Eyes:  PERRLA; EOMI HENT: normocephalic; atraumatic Neck: supple Lungs: clear to auscultation bilaterally Heart: regular rate and rhythm without murmer Skin: warm and dry Psychological: alert and cooperative; normal mood and affect   No Known Allergies  Past Medical History:  Diagnosis Date  . Hypertension   . PCOS (polycystic ovarian syndrome)    Social History   Socioeconomic History  . Marital status: Married    Spouse name: Not on file  . Number of children: Not on file  . Years of education: Not on file  . Highest education level: Not on file  Occupational History  . Not on file  Tobacco Use  . Smoking status: Never Smoker  . Smokeless tobacco: Never Used  Vaping Use  . Vaping Use: Never used  Substance and Sexual Activity  . Alcohol use: Yes  . Drug use: No  . Sexual activity: Not on file  Other Topics Concern  . Not on file  Social History Narrative  . Not on file   Social Determinants of Health   Financial Resource Strain: Not on file  Food Insecurity: Not on file  Transportation Needs: Not on file  Physical Activity: Not on file  Stress: Not on file  Social Connections: Not on file  Intimate Partner Violence: Not on file   No family history on file. Past Surgical History:  Procedure Laterality Date  . CESAREAN SECTION    . CHOLECYSTECTOMY    . TUBAL LIGATION        Mardella Layman, MD  03/07/20 1706  

## 2020-03-23 ENCOUNTER — Telehealth (HOSPITAL_COMMUNITY): Payer: Self-pay | Admitting: Urgent Care

## 2020-03-23 ENCOUNTER — Other Ambulatory Visit: Payer: Self-pay

## 2020-03-23 ENCOUNTER — Telehealth (HOSPITAL_COMMUNITY): Payer: Self-pay | Admitting: Emergency Medicine

## 2020-03-23 ENCOUNTER — Encounter (HOSPITAL_COMMUNITY): Payer: Self-pay | Admitting: Emergency Medicine

## 2020-03-23 ENCOUNTER — Ambulatory Visit (HOSPITAL_COMMUNITY)
Admission: EM | Admit: 2020-03-23 | Discharge: 2020-03-23 | Disposition: A | Discharge status: Home / Self Care | Payer: Commercial Managed Care - PPO | Attending: Urgent Care | Admitting: Urgent Care

## 2020-03-23 DIAGNOSIS — Z8616 Personal history of COVID-19: Secondary | ICD-10-CM | POA: Insufficient documentation

## 2020-03-23 DIAGNOSIS — R067 Sneezing: Secondary | ICD-10-CM | POA: Insufficient documentation

## 2020-03-23 DIAGNOSIS — B349 Viral infection, unspecified: Secondary | ICD-10-CM

## 2020-03-23 DIAGNOSIS — R059 Cough, unspecified: Secondary | ICD-10-CM | POA: Diagnosis present

## 2020-03-23 LAB — RESP PANEL BY RT-PCR (FLU A&B, COVID) ARPGX2
Influenza A by PCR: NEGATIVE
Influenza B by PCR: NEGATIVE
SARS Coronavirus 2 by RT PCR: NEGATIVE

## 2020-03-23 MED ORDER — PROMETHAZINE-DM 6.25-15 MG/5ML PO SYRP: 5.0000 mL | ORAL_SOLUTION | Freq: Every evening | ORAL | 0 refills | Status: DC | PRN

## 2020-03-23 MED ORDER — BENZONATATE 100 MG PO CAPS: 100.0000 mg | ORAL_CAPSULE | Freq: Three times a day (TID) | ORAL | 0 refills | Status: DC | PRN

## 2020-03-23 MED ORDER — CETIRIZINE HCL 10 MG PO TABS: 10.0000 mg | ORAL_TABLET | Freq: Every day | ORAL | 0 refills | Status: DC

## 2020-03-23 NOTE — Telephone Encounter (Signed)
Encounter opened in error

## 2020-03-23 NOTE — Telephone Encounter (Signed)
Patient called and requested a paper prescription.  This was done handed over to the patient.

## 2020-03-23 NOTE — ED Provider Notes (Signed)
[name]RG[facility]ARE [facility]   MRN: [id number] DOB: June 03, 1980  Subjective:   Annette Ford is a 39 y.o. female presenting for 5-day history of acute onset headache, coughing, sneezing.  Patient has a history of COVID-19 in August.  Has used over-the-counter Tylenol and Mucinex.  Has a history of hypertension and is on medication for this.  Denies chest pain, shortness of breath, body aches.  She is Covid vaccinated but not fully vaccinated.  No current facility-administered medications for this encounter.  Current Outpatient Medications:    amLODipine (NORVASC) 5 MG tablet, Take 1 tablet (5 mg total) by mouth daily., Disp: 30 tablet, Rfl: 2   atorvastatin (LIPITOR) 10 MG tablet, Take 10 mg by mouth daily., Disp: , Rfl:    metoprolol tartrate (LOPRESSOR) 100 MG tablet, Take 100 mg by mouth 2 (two) times daily., Disp: , Rfl:    No Known Allergies  Past Medical History:  Diagnosis Date   Hypertension    PCOS (polycystic ovarian syndrome)      Past Surgical History:  Procedure Laterality Date   CESAREAN SECTION     CHOLECYSTECTOMY     TUBAL LIGATION      History reviewed. No pertinent family history.  Social History   Tobacco Use   Smoking status: Never Smoker   Smokeless tobacco: Never Used  Vaping Use   Vaping Use: Never used  Substance Use Topics   Alcohol use: Yes   Drug use: No    ROS   Objective:   Vitals: BP (!) 150/99 (BP Location: Right Arm)    Pulse 69    Temp (!) 97.5 F (36.4 C) (Oral)    Resp 16    LMP 03/23/2020    SpO2 100%   BP Readings from Last 3 Encounters:  03/23/20 (!) 150/99  03/07/20 (!) 157/105  03/24/19 (!) 149/92   Physical Exam Constitutional:      General: She is not in acute distress.    Appearance: Normal appearance. She is well-developed. She is not ill-appearing, toxic-appearing or diaphoretic.  HENT:     Head: Normocephalic and atraumatic.     Nose: Nose normal.     Mouth/Throat:     Mouth:  Mucous membranes are moist.  Eyes:     Extraocular Movements: Extraocular movements intact.     Pupils: Pupils are equal, round, and reactive to light.  Cardiovascular:     Rate and Rhythm: Normal rate and regular rhythm.     Pulses: Normal pulses.     Heart sounds: Normal heart sounds. No murmur heard. No friction rub. No gallop.   Pulmonary:     Effort: Pulmonary effort is normal. No respiratory distress.     Breath sounds: Normal breath sounds. No stridor. No wheezing, rhonchi or rales.  Skin:    General: Skin is warm and dry.     Findings: No rash.  Neurological:     Mental Status: She is alert and oriented to person, place, and time.  Psychiatric:        Mood and Affect: Mood normal.        Behavior: Behavior normal.        Thought Content: Thought content normal.      Assessment and Plan :   PDMP not reviewed this encounter.  1. Viral syndrome   2. Cough   3. Sneezing   4. History of COVID-19     Will manage for viral illness such as viral URI, viral syndrome, viral  rhinitis, COVID-19. Counseled patient on nature of COVID-19 including modes of transmission, diagnostic testing, management and supportive care.  Offered scripts for symptomatic relief. COVID 19 testing is pending. Counseled patient on potential for adverse effects with medications prescribed/recommended today, ER and return-to-clinic precautions discussed, patient verbalized understanding.     Wallis Bamberg, PA-C 03/23/20 1143

## 2020-03-23 NOTE — ED Triage Notes (Signed)
PT C/O: cold sx onset 5 days associated w/headache, cough, sneezing  DENIES: f/n/v/d  TAKING MEDS: OTC Acetaminophen and Mucinex   A&O x4... NAD... Ambulatory

## 2020-03-23 NOTE — Telephone Encounter (Signed)
Patient reported having difficulty getting medicines from pharmacy ([facility]),  Requesting paper script.  Urban Gibson, pa provided paper script and left in file at patient access

## 2020-03-23 NOTE — Discharge Instructions (Addendum)

## 2020-08-03 ENCOUNTER — Encounter: Payer: Self-pay | Admitting: Emergency Medicine

## 2020-08-03 ENCOUNTER — Ambulatory Visit
Admission: EM | Admit: 2020-08-03 | Discharge: 2020-08-03 | Disposition: A | Discharge status: Home / Self Care | Payer: Commercial Managed Care - PPO | Attending: Emergency Medicine | Admitting: Emergency Medicine

## 2020-08-03 DIAGNOSIS — M79604 Pain in right leg: Secondary | ICD-10-CM | POA: Diagnosis not present

## 2020-08-03 DIAGNOSIS — R030 Elevated blood-pressure reading, without diagnosis of hypertension: Secondary | ICD-10-CM

## 2020-08-03 MED ORDER — PREDNISONE 20 MG PO TABS: 20.0000 mg | ORAL_TABLET | Freq: Two times a day (BID) | ORAL | 0 refills | Status: AC

## 2020-08-03 MED ORDER — CYCLOBENZAPRINE HCL 10 MG PO TABS: 10.0000 mg | ORAL_TABLET | Freq: Two times a day (BID) | ORAL | 0 refills | Status: DC | PRN

## 2020-08-03 NOTE — ED Triage Notes (Signed)
Pain in RT leg /hip area that started today.  Denies any injury.  Pt also reports bp is elevated.

## 2020-08-03 NOTE — ED Provider Notes (Addendum)
[facility]ARE [facility]ENTER   [id number] 08/03/20 Arrival Time: 1218  CC: Leg PAIN  SUBJECTIVE: History from: patient. Jonesha Tsuchiya is a 40 y.o. female complains of RT upper leg/ hip pain x this morning.  Denies a precipitating event or specific injury.  Localizes the pain to the outside of RT leg.  Describes the pain as intermittent and sharp in character.  Has tried OTC medications without relief.  Symptoms are made worse with movement.  Denies similar symptoms in the past.  Denies fever, chills, erythema, ecchymosis, effusion, weakness, numbness and tingling, saddle paresthesias, loss of bowel or bladder function.      ROS: As per HPI.  All other pertinent ROS negative.     Past Medical History:  Diagnosis Date  . Hypertension   . PCOS (polycystic ovarian syndrome)    Past Surgical History:  Procedure Laterality Date  . CESAREAN SECTION    . CHOLECYSTECTOMY    . TUBAL LIGATION     No Known Allergies No current facility-administered medications on file prior to encounter.   Current Outpatient Medications on File Prior to Encounter  Medication Sig Dispense Refill  . amLODipine (NORVASC) 5 MG tablet Take 1 tablet (5 mg total) by mouth daily. 30 tablet 2  . atorvastatin (LIPITOR) 10 MG tablet Take 10 mg by mouth daily.    . metoprolol tartrate (LOPRESSOR) 100 MG tablet Take 100 mg by mouth 2 (two) times daily.    . [DISCONTINUED] cetirizine (ZYRTEC ALLERGY) 10 MG tablet Take 1 tablet (10 mg total) by mouth daily. 30 tablet 0   Social History   Socioeconomic History  . Marital status: Married    Spouse name: Not on file  . Number of children: Not on file  . Years of education: Not on file  . Highest education level: Not on file  Occupational History  . Not on file  Tobacco Use  . Smoking status: Never Smoker  . Smokeless tobacco: Never Used  Vaping Use  . Vaping Use: Never used  Substance and Sexual Activity  . Alcohol use: Yes  . Drug use: No  . Sexual  activity: Not on file  Other Topics Concern  . Not on file  Social History Narrative  . Not on file   Social Determinants of Health   Financial Resource Strain: Not on file  Food Insecurity: Not on file  Transportation Needs: Not on file  Physical Activity: Not on file  Stress: Not on file  Social Connections: Not on file  Intimate Partner Violence: Not on file   No family history on file.  OBJECTIVE:  Vitals:   08/03/20 1245  BP: (!) 146/94  Pulse: 74  Resp: 18  Temp: 98.4 F (36.9 C)  TempSrc: Oral  SpO2: 97%    General appearance: ALERT; in no acute distress.  Head: NCAT Lungs: Normal respiratory effort Musculoskeletal: RT leg Inspection: Skin warm, dry, clear and intact without obvious erythema, effusion, or ecchymosis.  Palpation: diffusely TTP over lateral hip Strength: 5/5 hip flexion, 5/5 hip extension Skin: warm and dry Neurologic: Ambulates without difficulty Psychological: alert and cooperative; normal mood and affect  ASSESSMENT & PLAN:  1. Right leg pain   2. Elevated blood pressure reading     Meds ordered this encounter  Medications  . predniSONE (DELTASONE) 20 MG tablet    Sig: Take 1 tablet (20 mg total) by mouth 2 (two) times daily with a meal for 5 days.    Dispense:  10 tablet  Refill:  0    Order Specific Question:   Supervising Provider    Answer:   Eustace Moore [[id number]]  . cyclobenzaprine (FLEXERIL) 10 MG tablet    Sig: Take 1 tablet (10 mg total) by mouth 2 (two) times daily as needed for muscle spasms.    Dispense:  20 tablet    Refill:  0    Order Specific Question:   Supervising Provider    Answer:   Eustace Moore [[id number]]   Possible muscle strain/ spasm of low back vs. leg Continue conservative management of rest, ice, and gentle stretches Prednisone prescribed.  Take as directed and to completion Take cyclobenzaprine at nighttime for symptomatic relief. Avoid driving or operating heavy machinery while using  medication. Follow up with PCP if symptoms persist Return or go to the ER if you have any new or worsening symptoms (fever, chills, chest pain, abdominal pain, changes in bowel or bladder habits, pain radiating into lower legs, etc...)   Blood pressure elevated in office.  Please recheck in 24 hours.  If it continues to be greater than 140/90 please follow up with PCP for further evaluation and management.    Reviewed expectations re: course of current medical issues. Questions answered. Outlined signs and symptoms indicating need for more acute intervention. Patient verbalized understanding. After Visit Summary given.    Rennis Harding, PA-C 08/03/20 1334    Alvino Chapel Polo, PA-C 08/03/20 1335

## 2020-08-03 NOTE — Discharge Instructions (Addendum)
Continue conservative management of rest, ice, and gentle stretches Prednisone prescribed.  Take as directed and to completion Take cyclobenzaprine at nighttime for symptomatic relief. Avoid driving or operating heavy machinery while using medication. Follow up with PCP if symptoms persist Return or go to the ER if you have any new or worsening symptoms (fever, chills, chest pain, abdominal pain, changes in bowel or bladder habits, pain radiating into lower legs, etc...)   Blood pressure elevated in office.  Please recheck in 24 hours.  If it continues to be greater than 140/90 please follow up with PCP for further evaluation and management.

## 2020-08-30 ENCOUNTER — Ambulatory Visit (HOSPITAL_COMMUNITY): Payer: Self-pay

## 2020-08-30 ENCOUNTER — Ambulatory Visit (HOSPITAL_COMMUNITY)
Admission: EM | Admit: 2020-08-30 | Discharge: 2020-08-30 | Disposition: A | Discharge status: Home / Self Care | Payer: Commercial Managed Care - PPO | Attending: Family Medicine | Admitting: Family Medicine

## 2020-08-30 ENCOUNTER — Encounter (HOSPITAL_COMMUNITY): Payer: Self-pay | Admitting: Emergency Medicine

## 2020-08-30 ENCOUNTER — Other Ambulatory Visit: Payer: Self-pay

## 2020-08-30 DIAGNOSIS — I10 Essential (primary) hypertension: Secondary | ICD-10-CM | POA: Diagnosis not present

## 2020-08-30 MED ORDER — AMLODIPINE BESYLATE 5 MG PO TABS: 5.0000 mg | ORAL_TABLET | Freq: Every day | ORAL | 2 refills | Status: DC

## 2020-08-30 MED ORDER — METOPROLOL SUCCINATE ER 200 MG PO TB24
200.0000 mg | ORAL_TABLET | Freq: Every day | ORAL | 2 refills | Status: DC
Start: 2020-08-30 — End: 2022-02-06

## 2020-08-30 NOTE — ED Provider Notes (Signed)
  [facility] CARE CENTER   [id number] 08/30/20 Arrival Time: 1145  ASSESSMENT & PLAN:  1. Essential hypertension     Meds ordered this encounter  Medications  . amLODipine (NORVASC) 5 MG tablet    Sig: Take 1 tablet (5 mg total) by mouth daily.    Dispense:  30 tablet    Refill:  2  . metoprolol (TOPROL XL) 200 MG 24 hr tablet    Sig: Take 1 tablet (200 mg total) by mouth daily.    Dispense:  30 tablet    Refill:  2   She plans to schedule f/u with new PCP.  Reviewed expectations re: course of current medical issues. Questions answered. Outlined signs and symptoms indicating need for more acute intervention. Patient verbalized understanding. After Visit Summary given.   SUBJECTIVE:  Annette Ford is a 40 y.o. female who presents with concerns regarding increased blood pressures. Reports that she is treated for HTN. About to run out of meds.  She reports taking medications as instructed, no medication side effects noted, no chest pain on exertion, no dyspnea on exertion and no swelling of ankles.  Denies symptoms of chest pain, palpations, orthopnea, nocturnal dyspnea, or LE edema.  Social History   Tobacco Use  Smoking Status Never Smoker  Smokeless Tobacco Never Used     OBJECTIVE:  Vitals:   08/30/20 1153  BP: (!) 155/96  Pulse: 74  Resp: 18  Temp: 98.7 F (37.1 C)  TempSrc: Oral  SpO2: 100%    General appearance: alert; no distress Eyes: PERRLA; EOMI HENT: normocephalic; atraumatic Neck: supple Lungs: unlabored Abdomen: soft, non-tender; bowel sounds normal Extremities: no edema; symmetrical with no gross deformities Skin: warm and dry Psychological: alert and cooperative; normal mood and affect   No Known Allergies  Past Medical History:  Diagnosis Date  . Hypertension   . PCOS (polycystic ovarian syndrome)    Social History   Socioeconomic History  . Marital status: Married    Spouse name: Not on file  . Number of children:  Not on file  . Years of education: Not on file  . Highest education level: Not on file  Occupational History  . Not on file  Tobacco Use  . Smoking status: Never Smoker  . Smokeless tobacco: Never Used  Vaping Use  . Vaping Use: Never used  Substance and Sexual Activity  . Alcohol use: Yes  . Drug use: No  . Sexual activity: Not on file  Other Topics Concern  . Not on file  Social History Narrative  . Not on file   Social Determinants of Health   Financial Resource Strain: Not on file  Food Insecurity: Not on file  Transportation Needs: Not on file  Physical Activity: Not on file  Stress: Not on file  Social Connections: Not on file  Intimate Partner Violence: Not on file   History reviewed. No pertinent family history. Past Surgical History:  Procedure Laterality Date  . CESAREAN SECTION    . CHOLECYSTECTOMY    . TUBAL LIGATION        Mardella Layman, MD 08/30/20 1349

## 2020-08-30 NOTE — ED Triage Notes (Signed)
Pt is present today for a medication refill.  Pt states that she needs a refill on her Amlodipine. Pt states that she does not have a PCP right now

## 2021-01-03 DIAGNOSIS — E785 Hyperlipidemia, unspecified: Secondary | ICD-10-CM | POA: Insufficient documentation

## 2021-07-30 ENCOUNTER — Telehealth (HOSPITAL_COMMUNITY): Payer: Self-pay | Admitting: Emergency Medicine

## 2021-07-30 ENCOUNTER — Ambulatory Visit (INDEPENDENT_AMBULATORY_CARE_PROVIDER_SITE_OTHER): Payer: Commercial Managed Care - PPO

## 2021-07-30 ENCOUNTER — Encounter (HOSPITAL_COMMUNITY): Payer: Self-pay | Admitting: Emergency Medicine

## 2021-07-30 ENCOUNTER — Ambulatory Visit (HOSPITAL_COMMUNITY)
Admission: EM | Admit: 2021-07-30 | Discharge: 2021-07-30 | Disposition: A | Discharge status: Home / Self Care | Payer: Commercial Managed Care - PPO | Attending: Emergency Medicine | Admitting: Emergency Medicine

## 2021-07-30 DIAGNOSIS — S92424A Nondisplaced fracture of distal phalanx of right great toe, initial encounter for closed fracture: Secondary | ICD-10-CM

## 2021-07-30 DIAGNOSIS — M79674 Pain in right toe(s): Secondary | ICD-10-CM

## 2021-07-30 MED ORDER — IBUPROFEN 800 MG PO TABS: 800.0000 mg | ORAL_TABLET | Freq: Three times a day (TID) | ORAL | 0 refills | Status: DC | PRN

## 2021-07-30 MED ORDER — DICLOFENAC SODIUM 1 % EX GEL: 4.0000 g | Freq: Four times a day (QID) | CUTANEOUS | 2 refills | Status: DC

## 2021-07-30 NOTE — Discharge Instructions (Addendum)
The tip of your right great toe has been broken.  At this time, a cast is not recommended.  Instead we have provided you with a soft shoe that we would like for you to wear until you can be seen by orthopedics.   ? ?I provided contact information for a local orthopedic clinic that has urgent care hours from 8-5 Monday through Friday.  You do not need to call ahead to make an appointment.   ? ?I sent a prescription for ibuprofen 800 mg to your pharmacy that you can take as needed for pain.   ? ?Other ways to treat your pain include keeping your foot elevated, applying ice to your great toe, walking as little as possible or topical anti-inflammatories such as Voltaren gel. ? ?Thank you for visiting urgent care today. ?

## 2021-07-30 NOTE — ED Triage Notes (Signed)
Pt is present today with injury on with the big toe on the right foot. Pt states that she hit her toe on the cement when she was trying to kick a ball.  ?

## 2021-08-01 NOTE — ED Provider Notes (Signed)
?[facility] ? ? ? ?CSN: [id number] ?Arrival date & time: 07/30/21  Rickey Primus ?  ? ?HISTORY  ? ?Chief Complaint  ?Patient presents with  ? Toe Injury  ? ?HPI ?Annette Ford is a 41 y.o. female. Patient presents to urgent care today complaining that she stubbed her right great toe on the corner of a cement block while trying to kick a ball, states she was wearing soft shoes at the time.  Patient states this occurred 2 days ago.  Patient states that this morning she noticed that her toe was very bruised and swollen.  Patient states she is able to walk on it but has to walk on the outside of her right foot instead of walking with a normal gait.  Patient denies previous injury to her right great toe. ? ?The history is provided by the patient.  ?Past Medical History:  ?Diagnosis Date  ? Hypertension   ? PCOS (polycystic ovarian syndrome)   ? ?There are no problems to display for this patient. ? ?Past Surgical History:  ?Procedure Laterality Date  ? CESAREAN SECTION    ? CHOLECYSTECTOMY    ? TUBAL LIGATION    ? ?OB History   ?No obstetric history on file. ?  ? ?Home Medications   ? ?Prior to Admission medications   ?Medication Sig Start Date End Date Taking? Authorizing Provider  ?amLODipine (NORVASC) 5 MG tablet Take 1 tablet (5 mg total) by mouth daily. 08/30/20   Mardella Layman, MD  ?cyclobenzaprine (FLEXERIL) 10 MG tablet Take 1 tablet (10 mg total) by mouth 2 (two) times daily as needed for muscle spasms. 08/03/20   Wurst, [place], PA-C  ?diclofenac Sodium (VOLTAREN) 1 % GEL Apply 4 g topically 4 (four) times daily. Apply to affected areas 4 times daily as needed for pain. 07/30/21   Theadora Rama Scales, PA-C  ?ibuprofen (ADVIL) 800 MG tablet Take 1 tablet (800 mg total) by mouth every 8 (eight) hours as needed for up to 21 doses for fever, headache, mild pain or moderate pain. 07/30/21   Theadora Rama Scales, PA-C  ?metoprolol (TOPROL XL) 200 MG 24 hr tablet Take 1 tablet (200 mg total) by mouth daily.  08/30/20   Mardella Layman, MD  ?atorvastatin (LIPITOR) 10 MG tablet Take 10 mg by mouth daily. 03/07/20 08/30/20  [provider]  ?cetirizine (ZYRTEC ALLERGY) 10 MG tablet Take 1 tablet (10 mg total) by mouth daily. 03/23/20 08/03/20  Wallis Bamberg, PA-C  ?metoprolol tartrate (LOPRESSOR) 100 MG tablet Take 100 mg by mouth 2 (two) times daily.  08/30/20  [provider]  ? ? ?Family History ?History reviewed. No pertinent family history. ?Social History ?Social History  ? ?Tobacco Use  ? Smoking status: Never  ? Smokeless tobacco: Never  ?Vaping Use  ? Vaping Use: Never used  ?Substance Use Topics  ? Alcohol use: Yes  ? Drug use: No  ? ?Allergies   ?Patient has no known allergies. ? ?Review of Systems ?Review of Systems ?Pertinent findings noted in history of present illness.  ? ?Physical Exam ?Triage Vital Signs ?ED Triage Vitals  ?Enc Vitals Group  ?   BP 01/19/21 0827 (!) 147/82  ?   Pulse Rate 01/19/21 0827 72  ?   Resp 01/19/21 0827 18  ?   Temp 01/19/21 0827 98.3 ?F (36.8 ?C)  ?   Temp Source 01/19/21 0827 Oral  ?   SpO2 01/19/21 0827 98 %  ?   Weight --   ?  Height --   ?   Head Circumference --   ?   Peak Flow --   ?   Pain Score 01/19/21 0826 5  ?   Pain Loc --   ?   Pain Edu? --   ?   Excl. in GC? --   ?No data found. ? ?Updated Vital Signs ?BP (!) 151/118   Pulse 96   Temp 98.1 ?F (36.7 ?C) (Oral)   Resp 18   SpO2 100%  ? ?Physical Exam ?Vitals and nursing note reviewed.  ?Constitutional:   ?   General: She is not in acute distress. ?   Appearance: Normal appearance. She is not ill-appearing.  ?HENT:  ?   Head: Normocephalic and atraumatic.  ?Eyes:  ?   General: Lids are normal.     ?   Right eye: No discharge.     ?   Left eye: No discharge.  ?   Extraocular Movements: Extraocular movements intact.  ?   Conjunctiva/sclera: Conjunctivae normal.  ?   Right eye: Right conjunctiva is not injected.  ?   Left eye: Left conjunctiva is not injected.  ?Neck:  ?   Trachea: Trachea and phonation  normal.  ?Cardiovascular:  ?   Rate and Rhythm: Normal rate and regular rhythm.  ?   Pulses: Normal pulses.  ?   Heart sounds: Normal heart sounds. No murmur heard. ?  No friction rub. No gallop.  ?Pulmonary:  ?   Effort: Pulmonary effort is normal. No accessory muscle usage, prolonged expiration or respiratory distress.  ?   Breath sounds: Normal breath sounds. No stridor, decreased air movement or transmitted upper airway sounds. No decreased breath sounds, wheezing, rhonchi or rales.  ?Chest:  ?   Chest wall: No tenderness.  ?Musculoskeletal:  ?   Cervical back: Normal range of motion and neck supple. Normal range of motion.  ?   Right foot: Decreased range of motion. Normal capillary refill. Swelling, deformity, tenderness and bony tenderness present. No bunion, prominent metatarsal heads or laceration. Normal pulse.  ?   Left foot: Normal.  ?Lymphadenopathy:  ?   Cervical: No cervical adenopathy.  ?Skin: ?   General: Skin is warm and dry.  ?   Findings: No erythema or rash.  ?Neurological:  ?   General: No focal deficit present.  ?   Mental Status: She is alert and oriented to person, place, and time.  ?Psychiatric:     ?   Mood and Affect: Mood normal.     ?   Behavior: Behavior normal.  ? ? ?Visual Acuity ?Right Eye Distance:   ?Left Eye Distance:   ?Bilateral Distance:   ? ?Right Eye Near:   ?Left Eye Near:    ?Bilateral Near:    ? ?UC Couse / Diagnostics / Procedures:  ?  ?EKG ? ?Radiology ?DG Toe Great Right ? ?Result Date: 07/30/2021 ?CLINICAL DATA:  Stubbed toe EXAM: RIGHT GREAT TOE COMPARISON:  None Available. FINDINGS: Lucency noted through the proximal aspect of the right great toe distal phalanx compatible with nondisplaced fracture. No subluxation or dislocation. Joint spaces maintained. IMPRESSION: Nondisplaced fracture through the proximal aspect of the right great toe distal phalanx. Electronically Signed   By: Charlett Nose M.D.   On: 07/30/2021 19:54   ? ?Procedures ?Procedures (including critical  care time) ? ?UC Diagnoses / Final Clinical Impressions(s)   ?I have reviewed the triage vital signs and the nursing notes. ? ?Pertinent labs & imaging  results that were available during my care of the patient were reviewed by me and considered in my medical decision making (see chart for details).   ? ?Final diagnoses:  ?Closed nondisplaced fracture of distal phalanx of right great toe, initial encounter  ? ?Patient placed in a postop shoe, provided with ibuprofen 800 mg and Voltaren gel.  Patient provided with contact information for [facility] where I would like for her to be seen in the next 2 days for further evaluation and recommendations for treatment. ? ?ED Prescriptions   ? ? Medication Sig Dispense Auth. Provider  ? diclofenac Sodium (VOLTAREN) 1 % GEL Apply 4 g topically 4 (four) times daily. Apply to affected areas 4 times daily as needed for pain. 100 g Theadora RamaMorgan, Armine Rizzolo Scales, PA-C  ? ibuprofen (ADVIL) 800 MG tablet Take 1 tablet (800 mg total) by mouth every 8 (eight) hours as needed for up to 21 doses for fever, headache, mild pain or moderate pain. 21 tablet Theadora RamaMorgan, Besse Miron Scales, PA-C  ? ?  ? ?PDMP not reviewed this encounter. ? ?Pending results:  ?Labs Reviewed - No data to display ? ?Medications Ordered in UC: ?Medications - No data to display ? ?Disposition Upon Discharge:  ?Condition: stable for discharge home ?Home: take medications as prescribed; routine discharge instructions as discussed; follow up as advised. ? ?Patient presented with an acute illness with associated systemic symptoms and significant discomfort requiring urgent management. In my opinion, this is a condition that a prudent lay person (someone who possesses an average knowledge of health and medicine) may potentially expect to result in complications if not addressed urgently such as respiratory distress, impairment of bodily function or dysfunction of bodily organs.  ? ?Routine symptom specific, illness specific  and/or disease specific instructions were discussed with the patient and/or caregiver at length.  ? ?As such, the patient has been evaluated and assessed, work-up was performed and treatment was provided in align

## 2021-08-02 DIAGNOSIS — S92423A Displaced fracture of distal phalanx of unspecified great toe, initial encounter for closed fracture: Secondary | ICD-10-CM | POA: Insufficient documentation

## 2022-02-06 ENCOUNTER — Ambulatory Visit
Admission: EM | Admit: 2022-02-06 | Discharge: 2022-02-06 | Disposition: A | Discharge status: Home / Self Care | Payer: Commercial Managed Care - PPO | Attending: Nurse Practitioner | Admitting: Nurse Practitioner

## 2022-02-06 DIAGNOSIS — R519 Headache, unspecified: Secondary | ICD-10-CM

## 2022-02-06 DIAGNOSIS — M542 Cervicalgia: Secondary | ICD-10-CM | POA: Diagnosis not present

## 2022-02-06 DIAGNOSIS — I10 Essential (primary) hypertension: Secondary | ICD-10-CM

## 2022-02-06 MED ORDER — IBUPROFEN 800 MG PO TABS: 800.0000 mg | ORAL_TABLET | Freq: Three times a day (TID) | ORAL | 0 refills | Status: DC | PRN

## 2022-02-06 MED ORDER — KETOROLAC TROMETHAMINE 30 MG/ML IJ SOLN
30.0000 mg | Freq: Once | INTRAMUSCULAR | Status: AC
  Administered 2022-02-06: 30 mg via INTRAMUSCULAR

## 2022-02-06 MED ORDER — METOPROLOL SUCCINATE ER 200 MG PO TB24: 200.0000 mg | ORAL_TABLET | Freq: Every day | ORAL | 0 refills | Status: DC

## 2022-02-06 MED ORDER — AMLODIPINE BESYLATE 5 MG PO TABS: 5.0000 mg | ORAL_TABLET | Freq: Every day | ORAL | 0 refills | Status: DC

## 2022-02-06 NOTE — ED Triage Notes (Signed)
Pt states headache on and off for the past few weeks. States she is out of her blood pressure medications and needs a refill. Also states she woke up to today right sided neck pain. Has been taking Tylenol with some relief.

## 2022-02-06 NOTE — ED Provider Notes (Signed)
[facility]    CSN: [id number] Arrival date & time: 02/06/22  1332      History   Chief Complaint Chief Complaint  Patient presents with   Headache    HPI Annette Ford is a 41 y.o. female.   Patient presents today with concerns of elevated blood pressure, headache, neck pain.  Reports she ran out of metoprolol XL 200 mg a few weeks ago and ran out of amlodipine 5 mg earlier this week.  Reports that headache has been somewhat persistent.  She denies vision changes, chest pain, shortness of breath, lightheadedness/dizziness and new lower extremity swelling.    Reports she began having neck pain this morning when she woke up and is concerned this may be because of her blood pressure.  Reports the neck pain is only on the right side of her neck and hurts worse when she turns her head to the right side.  She denies radiation of pain down the arm.  No recent injury, accident, trauma, or fall to her neck.  No weakness, numbness or tingling shooting down the arm, or fever, nausea/vomiting since the pain began.  She has taken Tylenol for the neck pain which helps minimally.     Past Medical History:  Diagnosis Date   Hypertension    PCOS (polycystic ovarian syndrome)     There are no problems to display for this patient.   Past Surgical History:  Procedure Laterality Date   CESAREAN SECTION     CHOLECYSTECTOMY     TUBAL LIGATION      OB History   No obstetric history on file.      Home Medications    Prior to Admission medications   Medication Sig Start Date End Date Taking? Authorizing Provider  amLODipine (NORVASC) 5 MG tablet Take 1 tablet (5 mg total) by mouth daily. 02/06/22   Valentino Nose, NP  cyclobenzaprine (FLEXERIL) 10 MG tablet Take 1 tablet (10 mg total) by mouth 2 (two) times daily as needed for muscle spasms. 08/03/20   Wurst, [place], PA-C  diclofenac Sodium (VOLTAREN) 1 % GEL Apply 4 g topically 4 (four) times daily. Apply to  affected areas 4 times daily as needed for pain. 07/30/21   Theadora Rama Scales, PA-C  ibuprofen (ADVIL) 800 MG tablet Take 1 tablet (800 mg total) by mouth every 8 (eight) hours as needed for up to 21 doses for fever, headache, mild pain or moderate pain. Take with food to prevent GI upset 02/06/22   Valentino Nose, NP  metoprolol (TOPROL XL) 200 MG 24 hr tablet Take 1 tablet (200 mg total) by mouth daily. 02/06/22   Valentino Nose, NP  atorvastatin (LIPITOR) 10 MG tablet Take 10 mg by mouth daily. 03/07/20 08/30/20  [provider]  cetirizine (ZYRTEC ALLERGY) 10 MG tablet Take 1 tablet (10 mg total) by mouth daily. 03/23/20 08/03/20  Wallis Bamberg, PA-C  metoprolol tartrate (LOPRESSOR) 100 MG tablet Take 100 mg by mouth 2 (two) times daily.  08/30/20  [provider]    Family History History reviewed. No pertinent family history.  Social History Social History   Tobacco Use   Smoking status: Never   Smokeless tobacco: Never  Vaping Use   Vaping Use: Never used  Substance Use Topics   Alcohol use: Yes   Drug use: No     Allergies   Patient has no known allergies.   Review of Systems Review of Systems Per HPI  Physical  Exam Triage Vital Signs ED Triage Vitals  Enc Vitals Group     BP 02/06/22 1359 (!) 173/105     Pulse Rate 02/06/22 1359 98     Resp 02/06/22 1359 16     Temp 02/06/22 1359 98.4 F (36.9 C)     Temp Source 02/06/22 1359 Oral     SpO2 02/06/22 1359 98 %     Weight --      Height --      Head Circumference --      Peak Flow --      Pain Score 02/06/22 1400 4     Pain Loc --      Pain Edu? --      Excl. in GC? --    No data found.  Updated Vital Signs BP (!) 173/105 (BP Location: Right Arm)   Pulse 98   Temp 98.4 F (36.9 C) (Oral)   Resp 16   LMP 02/01/2022 (Approximate)   SpO2 98%   Visual Acuity Right Eye Distance:   Left Eye Distance:   Bilateral Distance:    Right Eye Near:   Left Eye Near:    Bilateral  Near:     Physical Exam Vitals and nursing note reviewed.  Constitutional:      General: She is not in acute distress.    Appearance: She is well-developed. She is not toxic-appearing.  HENT:     Head: Normocephalic and atraumatic.     Mouth/Throat:     Mouth: Mucous membranes are moist.     Pharynx: Oropharynx is clear.  Eyes:     Extraocular Movements: Extraocular movements intact.     Right eye: Normal extraocular motion.     Left eye: Normal extraocular motion.     Pupils: Pupils are equal, round, and reactive to light. Pupils are equal.  Cardiovascular:     Rate and Rhythm: Normal rate and regular rhythm.  Pulmonary:     Effort: Pulmonary effort is normal. No respiratory distress.     Breath sounds: Normal breath sounds. No wheezing, rhonchi or rales.  Musculoskeletal:     Cervical back: Normal range of motion and neck supple. No rigidity.  Lymphadenopathy:     Cervical: No cervical adenopathy.  Skin:    General: Skin is warm and dry.     Coloration: Skin is not cyanotic or pale.     Findings: No erythema or rash.  Neurological:     Mental Status: She is alert and oriented to person, place, and time.     Cranial Nerves: No cranial nerve deficit or facial asymmetry.     Gait: Gait normal.  Psychiatric:        Behavior: Behavior is cooperative.      UC Treatments / Results  Labs (all labs ordered are listed, but only abnormal results are displayed) Labs Reviewed - No data to display  EKG   Radiology No results found.  Procedures Procedures (including critical care time)  Medications Ordered in UC Medications  ketorolac (TORADOL) 30 MG/ML injection 30 mg (30 mg Intramuscular Given 02/06/22 1427)    Initial Impression / Assessment and Plan / UC Course  I have reviewed the triage vital signs and the nursing notes.  Pertinent labs & imaging results that were available during my care of the patient were reviewed by me and considered in my medical decision  making (see chart for details).   Patient is well-appearing, afebrile, not tachycardic, not tachypneic, oxygenating well on  room air.  She is hypertensive today, likely secondary to running out of antihypertensive medication.  Essential hypertension Resume previous medications including metoprolol XL 200 mg daily, amlodipine 5 mg daily Recommended close follow-up with primary care provider for further evaluation of blood pressure while taking blood pressure medication to ensure effectiveness, possibility of blood work PCP appointment made today while in urgent care  Acute intractable headache, unspecified headache type Suspect secondary to elevated blood pressure Noted flag symptoms including no vision changes, dizziness/lightheadedness, chest pain, shortness of breath, new lower extremity swelling Without much benefit from Tylenol, headache treated with Toradol 30 mg IM today in urgent care Discussed use of Tylenol 800 mg every 8 hours starting tomorrow as needed for headache  Neck pain Suspect musculoskeletal cause; no red flags in history or on exam today Pain treated with Toradol 30 mg IM today in urgent care Recommended gentle range of motion/light stretching exercises Note given for work  The patient was given the opportunity to ask questions.  All questions answered to their satisfaction.  The patient is in agreement to this plan.    Final Clinical Impressions(s) / UC Diagnoses   Final diagnoses:  Essential hypertension  Acute intractable headache, unspecified headache type  Neck pain     Discharge Instructions      Please resume the blood pressure medication as prescribed.  Make sure you are drinking plenty of water.  Plan to follow up with your new PCP as scheduled.  The pain in your neck is most likely from a neck strain.  We have given you a shot of Toradol today for pain/inflammation.  Starting tomorrow, you can take the ibuprofen 800 mg every 8 hours as needed for  pain.  You can also try light stretching/range of motion exercises for your neck.   ED Prescriptions     Medication Sig Dispense Auth. Provider   metoprolol (TOPROL XL) 200 MG 24 hr tablet Take 1 tablet (200 mg total) by mouth daily. 30 tablet Cathlean Marseilles A, NP   ibuprofen (ADVIL) 800 MG tablet Take 1 tablet (800 mg total) by mouth every 8 (eight) hours as needed for up to 21 doses for fever, headache, mild pain or moderate pain. Take with food to prevent GI upset 21 tablet Cathlean Marseilles A, NP   amLODipine (NORVASC) 5 MG tablet Take 1 tablet (5 mg total) by mouth daily. 30 tablet Valentino Nose, NP      PDMP not reviewed this encounter.   Valentino Nose, NP 02/06/22 1635

## 2022-02-06 NOTE — Discharge Instructions (Addendum)
Please resume the blood pressure medication as prescribed.  Make sure you are drinking plenty of water.  Plan to follow up with your new PCP as scheduled.  The pain in your neck is most likely from a neck strain.  We have given you a shot of Toradol today for pain/inflammation.  Starting tomorrow, you can take the ibuprofen 800 mg every 8 hours as needed for pain.  You can also try light stretching/range of motion exercises for your neck.

## 2022-02-18 ENCOUNTER — Ambulatory Visit (INDEPENDENT_AMBULATORY_CARE_PROVIDER_SITE_OTHER): Payer: Commercial Managed Care - PPO | Admitting: Physician Assistant

## 2022-02-18 ENCOUNTER — Encounter: Payer: Self-pay | Admitting: Physician Assistant

## 2022-02-18 VITALS — BP 166/110 | HR 90 | Temp 98.2°F | Ht 65.0 in | Wt 203.2 lb

## 2022-02-18 DIAGNOSIS — Z13220 Encounter for screening for lipoid disorders: Secondary | ICD-10-CM | POA: Diagnosis not present

## 2022-02-18 DIAGNOSIS — Z23 Encounter for immunization: Secondary | ICD-10-CM | POA: Diagnosis not present

## 2022-02-18 DIAGNOSIS — Z8632 Personal history of gestational diabetes: Secondary | ICD-10-CM

## 2022-02-18 DIAGNOSIS — Z131 Encounter for screening for diabetes mellitus: Secondary | ICD-10-CM | POA: Diagnosis not present

## 2022-02-18 DIAGNOSIS — I10 Essential (primary) hypertension: Secondary | ICD-10-CM | POA: Diagnosis not present

## 2022-02-18 DIAGNOSIS — Z862 Personal history of diseases of the blood and blood-forming organs and certain disorders involving the immune mechanism: Secondary | ICD-10-CM | POA: Insufficient documentation

## 2022-02-18 MED ORDER — AMLODIPINE BESYLATE 10 MG PO TABS: 10.0000 mg | ORAL_TABLET | Freq: Every day | ORAL | 0 refills | Status: DC

## 2022-02-18 NOTE — Patient Instructions (Addendum)
Welcome to [facility]! It was a pleasure meeting you today.  As discussed, Please schedule a 2-3 week follow up visit today.  Increase amlodipine 10 mg Continue on Toprol Monitor BP at home and bring readings to next appt   PLEASE NOTE:  If you had any LAB tests please let us know if you have not heard back within a few days. You may see your results on MyChart before we have a chance to review them but we will give you a call once they are reviewed by [place]. If we ordered any REFERRALS today, please let us know if you have not heard from their office within the next two weeks. Let us know through MyChart if you are needing REFILLS, or have your pharmacy send [place] the request. You can also use MyChart to communicate with me or any office staff.  Please try these tips to maintain a healthy lifestyle:  Eat most of your calories during the day when you are active. Eliminate processed foods including packaged sweets (pies, cakes, cookies), reduce intake of potatoes, white bread, white pasta, and white rice. Look for whole grain options, oat flour or almond flour.  Each meal should contain half fruits/vegetables, one quarter protein, and one quarter carbs (no bigger than a computer mouse).  Cut down on sweet beverages. This includes juice, soda, and sweet tea. Also watch fruit intake, though this is a healthier sweet option, it still contains natural sugar! Limit to 3 servings daily.  Drink at least 1 glass of water with each meal and aim for at least 8 glasses (64 ounces) per day.  Exercise at least 150 minutes every week to the best of your ability.    Take Care,  Terrika Zuver, PA-C

## 2022-02-18 NOTE — Progress Notes (Unsigned)
Subjective:    Patient ID: Annette Ford, female    DOB: Aug 01, 1980, 41 y.o.   MRN: [id number]  Chief Complaint  Patient presents with   New Patient (Initial Visit)    NP in office to establish care with PCP; pt states BP is her overall concerns; pt Hx of  PCOS and been having 2 cycles per month for past 2-3 months; has had tubal ligation; pt due for annual CPE; but not fasting today;  can do labs at office if we give her orders; pt feels like she is battling some depression due to Mom passing away last year. Wanting referral or new gyn care either here or referral. Request flu vaccine today    HPI 41 y.o. patient presents today for new patient establishment with me.    Current Care Team: None   Acute Concerns: Blood pressure -Amlodipine 5 mg and Toprol XL 200 mg daily, taking these consistently; started on 2012 ?; has previously seen cardiologist for high heart rate, work-up negative; strong HTN hx in family; pt does not smoke or drink -Urgent Care visit on 02/06/22 - symptomatic HTN, headache, was out of Toprol -No symptoms today    Past Medical History:  Diagnosis Date   Hypertension    PCOS (polycystic ovarian syndrome)    PCOS (polycystic ovarian syndrome)     Past Surgical History:  Procedure Laterality Date   CESAREAN SECTION     CHOLECYSTECTOMY     TUBAL LIGATION      Family History  Problem Relation Age of Onset   Alcohol abuse Mother    COPD Mother        cause of death   Depression Mother    Diabetes Mother    Drug abuse Mother    Mental illness Mother    Hypertension Mother    Schizophrenia Mother    Anxiety disorder Mother    Congestive Heart Failure Mother    Alcohol abuse Father    Drug abuse Father    Stroke Father        cause of death - heat stroke   Mental illness Father    Depression Father    Anxiety disorder Father    Bipolar disorder Father    Hypertension Sister    Diabetes Maternal Grandmother    Hypertension Maternal  Grandmother    Diabetes Maternal Grandfather    COPD Maternal Grandfather    Hypertension Maternal Grandfather    Heart disease Paternal Grandmother    Hypertension Paternal Grandmother    Diabetes Paternal Grandfather    Hypertension Paternal Grandfather    Cancer Maternal Aunt    Diabetes Paternal Aunt     Social History   Tobacco Use   Smoking status: Never   Smokeless tobacco: Never  Vaping Use   Vaping Use: Never used  Substance Use Topics   Alcohol use: Yes    Comment: occasionally   Drug use: No     No Known Allergies  Review of Systems NEGATIVE UNLESS OTHERWISE INDICATED IN HPI      Objective:     BP (!) 166/110 (BP Location: Right Arm)   Pulse 90   Temp 98.2 F (36.8 C) (Temporal)   Ht 5\' 5"  (1.651 m)   Wt 203 lb 3.2 oz (92.2 kg)   LMP 02/01/2022 (Approximate)   SpO2 99%   BMI 33.81 kg/m   Wt Readings from Last 3 Encounters:  02/18/22 203 lb 3.2 oz (92.2 kg)  12/22/18 180 lb (  81.6 kg)  07/04/17 194 lb (88 kg)    BP Readings from Last 3 Encounters:  02/19/22 (!) 156/91  02/18/22 (!) 166/110  02/06/22 (!) 173/105     Physical Exam Vitals and nursing note reviewed.  Constitutional:      Appearance: Normal appearance. She is normal weight. She is not toxic-appearing.  HENT:     Head: Normocephalic and atraumatic.     Right Ear: Tympanic membrane, ear canal and external ear normal.     Left Ear: Tympanic membrane, ear canal and external ear normal.     Nose: Nose normal.     Mouth/Throat:     Mouth: Mucous membranes are moist.  Eyes:     Extraocular Movements: Extraocular movements intact.     Conjunctiva/sclera: Conjunctivae normal.     Pupils: Pupils are equal, round, and reactive to light.  Cardiovascular:     Rate and Rhythm: Normal rate and regular rhythm.     Pulses: Normal pulses.     Heart sounds: Normal heart sounds.  Pulmonary:     Effort: Pulmonary effort is normal.     Breath sounds: Normal breath sounds.  Abdominal:      General: Abdomen is flat. Bowel sounds are normal.     Palpations: Abdomen is soft.  Musculoskeletal:        General: Normal range of motion.     Cervical back: Normal range of motion and neck supple.  Skin:    General: Skin is warm and dry.  Neurological:     General: No focal deficit present.     Mental Status: She is alert and oriented to person, place, and time.  Psychiatric:        Mood and Affect: Mood normal.        Behavior: Behavior normal.        Thought Content: Thought content normal.        Judgment: Judgment normal.        Assessment & Plan:  Essential hypertension -     CBC with Differential/Platelet; Future -     Comprehensive metabolic panel; Future -     Lipid panel; Future -     TSH; Future  Diabetes mellitus screening -     Comprehensive metabolic panel; Future -     Hemoglobin A1c; Future  Screening for cholesterol level -     Lipid panel; Future  History of gestational diabetes  History of anemia -     CBC with Differential/Platelet; Future  Need for immunization against influenza -     Flu Vaccine QUAD 37mo+IM (Fluarix, Fluzone & Alfiuria Quad PF)  Other orders -     amLODIPine Besylate; Take 1 tablet (10 mg total) by mouth daily.  Dispense: 30 tablet; Refill: 0   Pleasant new patient establishment today.  History of hypertension, which is uncontrolled at this time.  She is asymptomatic today.  Plan to increase amlodipine to 10 mg daily.  She will continue on Toprol XL 200 mg once daily.  I asked her to monitor her blood pressure readings and bring a log with her to recheck.  She needs to work on lifestyle changes including decreased salt and processed foods, increasing exercise, increasing water intake.  Red flags discussed with patient and ER precautions also discussed.  Patient agreeable and understanding.  She is due for overall lab updates and we can do this today and treat pending any abnormal results.  Patient knows to reach out sooner if any  questions or concerns.    Return in about 3 weeks (around 03/11/2022) for BP recheck with Lakai Moree, fasting labs same day .  This note was prepared with assistance of Conservation officer, historic buildings. Occasional wrong-word or sound-a-like substitutions may have occurred due to the inherent limitations of voice recognition software.   Sundi Slevin M Emmalynne Courtney, PA-C

## 2022-02-19 ENCOUNTER — Telehealth: Payer: Self-pay | Admitting: Physician Assistant

## 2022-02-19 ENCOUNTER — Ambulatory Visit
Admission: EM | Admit: 2022-02-19 | Discharge: 2022-02-19 | Disposition: A | Discharge status: Home / Self Care | Payer: Commercial Managed Care - PPO | Attending: Family Medicine | Admitting: Family Medicine

## 2022-02-19 DIAGNOSIS — J069 Acute upper respiratory infection, unspecified: Secondary | ICD-10-CM

## 2022-02-19 DIAGNOSIS — Z1152 Encounter for screening for COVID-19: Secondary | ICD-10-CM | POA: Insufficient documentation

## 2022-02-19 LAB — RESP PANEL BY RT-PCR (FLU A&B, COVID) ARPGX2
Influenza A by PCR: NEGATIVE
Influenza B by PCR: NEGATIVE
SARS Coronavirus 2 by RT PCR: NEGATIVE

## 2022-02-19 MED ORDER — FLUTICASONE PROPIONATE 50 MCG/ACT NA SUSP: 1.0000 | Freq: Two times a day (BID) | NASAL | 2 refills | Status: DC

## 2022-02-19 MED ORDER — PROMETHAZINE-DM 6.25-15 MG/5ML PO SYRP: 5.0000 mL | ORAL_SOLUTION | Freq: Four times a day (QID) | ORAL | 0 refills | Status: DC | PRN

## 2022-02-19 NOTE — Telephone Encounter (Signed)
Noted and agreed, thank you. 

## 2022-02-19 NOTE — Telephone Encounter (Signed)
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 If symptomatic, describe symptoms. ---Has coughed up blood streaked mucus once. Does the patient have any new or worsening symptoms? ---Yes Will a triage be completed? ---Yes Related visit to physician within the last 2 weeks? ---No Does the PT have any chronic conditions? (i.e. diabetes, asthma, this includes High risk factors for pregnancy, etc.) ---Yes List chronic conditions. ---HTN Is the patient pregnant or possibly pregnant? (Ask all females between the ages of 63-55) ---No Is this a behavioral health or substance abuse call? ---No Guidelines Guideline Title Affirmed Question Affirmed Notes Nurse Date/Time (Eastern Time) Coughing Up Blood [1] Coughed up blood AND [2] > 1 tablespoon (15 ml) Turner, RN, Lupe Carney 02/19/2022 10:08:18 AM Disp. Time Lamount Cohen Time) Disposition Final User 02/19/2022 10:17:44 AM See HCP within 4 Hours (or PCP triage) Yes Mayford Knife, RN, Lupe Carney Final Disposition 02/19/2022 10:17:44 AM See HCP within 4 Hours (or PCP triage) Yes Mayford Knife, RN, Lupe Carney Caller Disagree/Comply Comply Caller Understands Yes PreDisposition  Call Doctor Care Advice Given Per Guideline SEE HCP (OR PCP TRIAGE) WITHIN 4 HOURS: * IF OFFICE WILL BE OPEN: You need to be seen within the next 3 or 4 hours. Call your doctor (or NP/PA) now or as soon as the office opens. CALL BACK IF: * You become worse CARE ADVICE given per Coughing Up Blood guideline. Comments User: Hadley Pen, RN Date/Time Lamount Cohen Time): 02/19/2022 10:18:51 AM Unable to schedule in office within 4 hours. Advised to proceed to UC. Referrals [facility] - UC

## 2022-02-19 NOTE — Telephone Encounter (Signed)
Patient called stating she woke up with cold like symptoms and is now coughing up blood. Patient has been triaged.

## 2022-02-19 NOTE — Telephone Encounter (Signed)
Awaiting triage note for patient

## 2022-02-19 NOTE — ED Triage Notes (Signed)
Pt reports cough  and sinus pressure since this morning. Reports blood spots in the phlegm.

## 2022-02-19 NOTE — Telephone Encounter (Signed)
Pt was advised to see provider with in 4 hours but no availability and was directed to UC per triage note

## 2022-02-23 NOTE — ED Provider Notes (Signed)
[facility]    CSN: [id number] Arrival date & time: 02/19/22  1036      History   Chief Complaint Chief Complaint  Patient presents with   Cough    HPI Annette Ford is a 41 y.o. female.   Presenting today with 1 day history of cough, nasal congestion, sinus pressure.  States has been some blood spots in her phlegm when she coughs so her PCP wanted her to get checked out.  Denies known fever, chills, chest pain, shortness of breath, abdominal pain, nausea vomiting or diarrhea.  So far not trying anything over-the-counter for symptoms.  No known history of chronic pulmonary disease.  Not on any chronic anticoagulation.    Past Medical History:  Diagnosis Date   Hypertension    PCOS (polycystic ovarian syndrome)    PCOS (polycystic ovarian syndrome)     Patient Active Problem List   Diagnosis Date Noted   Essential hypertension 02/18/2022   History of gestational diabetes 02/18/2022   History of anemia 02/18/2022    Past Surgical History:  Procedure Laterality Date   CESAREAN SECTION     CHOLECYSTECTOMY     TUBAL LIGATION      OB History   No obstetric history on file.      Home Medications    Prior to Admission medications   Medication Sig Start Date End Date Taking? Authorizing Provider  fluticasone (FLONASE) 50 MCG/ACT nasal spray Place 1 spray into both nostrils 2 (two) times daily. 02/19/22  Yes Particia Nearing, PA-C  promethazine-dextromethorphan (PROMETHAZINE-DM) 6.25-15 MG/5ML syrup Take 5 mLs by mouth 4 (four) times daily as needed. 02/19/22  Yes Particia Nearing, PA-C  amLODipine (NORVASC) 10 MG tablet Take 1 tablet (10 mg total) by mouth daily. 02/18/22 03/20/22  Allwardt, Crist Infante, PA-C  cyclobenzaprine (FLEXERIL) 10 MG tablet Take 1 tablet (10 mg total) by mouth 2 (two) times daily as needed for muscle spasms. Patient not taking: Reported on 02/18/2022 08/03/20   Alvino Chapel, [place], PA-C  diclofenac Sodium  (VOLTAREN) 1 % GEL Apply 4 g topically 4 (four) times daily. Apply to affected areas 4 times daily as needed for pain. Patient not taking: Reported on 02/18/2022 07/30/21   Theadora Rama Scales, PA-C  ibuprofen (ADVIL) 800 MG tablet Take 1 tablet (800 mg total) by mouth every 8 (eight) hours as needed for up to 21 doses for fever, headache, mild pain or moderate pain. Take with food to prevent GI upset 02/06/22   Valentino Nose, NP  metoprolol (TOPROL XL) 200 MG 24 hr tablet Take 1 tablet (200 mg total) by mouth daily. 02/06/22   Valentino Nose, NP  atorvastatin (LIPITOR) 10 MG tablet Take 10 mg by mouth daily. 03/07/20 08/30/20  [provider]  cetirizine (ZYRTEC ALLERGY) 10 MG tablet Take 1 tablet (10 mg total) by mouth daily. 03/23/20 08/03/20  Wallis Bamberg, PA-C  metoprolol tartrate (LOPRESSOR) 100 MG tablet Take 100 mg by mouth 2 (two) times daily.  08/30/20  [provider]    Family History Family History  Problem Relation Age of Onset   Alcohol abuse Mother    COPD Mother        cause of death   Depression Mother    Diabetes Mother    Drug abuse Mother    Mental illness Mother    Hypertension Mother    Schizophrenia Mother    Anxiety disorder Mother    Congestive Heart Failure Mother  Alcohol abuse Father    Drug abuse Father    Stroke Father        cause of death - heat stroke   Mental illness Father    Depression Father    Anxiety disorder Father    Bipolar disorder Father    Hypertension Sister    Diabetes Maternal Grandmother    Hypertension Maternal Grandmother    Diabetes Maternal Grandfather    COPD Maternal Grandfather    Hypertension Maternal Grandfather    Heart disease Paternal Grandmother    Hypertension Paternal Grandmother    Diabetes Paternal Grandfather    Hypertension Paternal Grandfather    Cancer Maternal Aunt    Diabetes Paternal Aunt     Social History Social History   Tobacco Use   Smoking status: Never    Smokeless tobacco: Never  Vaping Use   Vaping Use: Never used  Substance Use Topics   Alcohol use: Yes    Comment: occasionally   Drug use: No     Allergies   Patient has no known allergies.   Review of Systems Review of Systems PER HPI  Physical Exam Triage Vital Signs ED Triage Vitals  Enc Vitals Group     BP 02/19/22 1234 (!) 156/91     Pulse Rate 02/19/22 1234 88     Resp 02/19/22 1234 18     Temp 02/19/22 1234 98.1 F (36.7 C)     Temp Source 02/19/22 1234 Oral     SpO2 02/19/22 1234 98 %     Weight --      Height --      Head Circumference --      Peak Flow --      Pain Score 02/19/22 1233 0     Pain Loc --      Pain Edu? --      Excl. in GC? --    No data found.  Updated Vital Signs BP (!) 156/91 (BP Location: Right Arm)   Pulse 88   Temp 98.1 F (36.7 C) (Oral)   Resp 18   LMP 02/01/2022 (Approximate)   SpO2 98%   Visual Acuity Right Eye Distance:   Left Eye Distance:   Bilateral Distance:    Right Eye Near:   Left Eye Near:    Bilateral Near:     Physical Exam Vitals and nursing note reviewed.  Constitutional:      Appearance: Normal appearance.  HENT:     Head: Atraumatic.     Right Ear: Tympanic membrane and external ear normal.     Left Ear: Tympanic membrane and external ear normal.     Nose: Rhinorrhea present.     Mouth/Throat:     Mouth: Mucous membranes are moist.     Pharynx: Posterior oropharyngeal erythema present.  Eyes:     Extraocular Movements: Extraocular movements intact.     Conjunctiva/sclera: Conjunctivae normal.  Cardiovascular:     Rate and Rhythm: Normal rate and regular rhythm.     Heart sounds: Normal heart sounds.  Pulmonary:     Effort: Pulmonary effort is normal. No respiratory distress.     Breath sounds: Normal breath sounds. No wheezing or rales.  Musculoskeletal:        General: Normal range of motion.     Cervical back: Normal range of motion and neck supple.  Skin:    General: Skin is warm  and dry.  Neurological:     Mental Status: She is alert  and oriented to person, place, and time.  Psychiatric:        Mood and Affect: Mood normal.        Thought Content: Thought content normal.      UC Treatments / Results  Labs (all labs ordered are listed, but only abnormal results are displayed) Labs Reviewed  RESP PANEL BY RT-PCR (FLU A&B, COVID) [id number]    EKG   Radiology No results found.  Procedures Procedures (including critical care time)  Medications Ordered in UC Medications - No data to display  Initial Impression / Assessment and Plan / UC Course  I have reviewed the triage vital signs and the nursing notes.  Pertinent labs & imaging results that were available during my care of the patient were reviewed by me and considered in my medical decision making (see chart for details).     Vital signs and exam very reassuring today, suspicious for viral upper respiratory infection.  She is in no respiratory distress, oxygen saturation is 98% on room air.  Respiratory panel pending, treat with Phenergan DM, Flonase, over-the-counter cold and congestion medications.  Return for worsening symptoms.  Final Clinical Impressions(s) / UC Diagnoses   Final diagnoses:  Viral URI with cough   Discharge Instructions   None    ED Prescriptions     Medication Sig Dispense Auth. Provider   fluticasone (FLONASE) 50 MCG/ACT nasal spray Place 1 spray into both nostrils 2 (two) times daily. 16 g Roosvelt Maser [place], [place]   promethazine-dextromethorphan (PROMETHAZINE-DM) 6.25-15 MG/5ML syrup Take 5 mLs by mouth 4 (four) times daily as needed. 100 mL Particia Nearing, [place]      PDMP not reviewed this encounter.   Roosvelt Maser [place], [place] 02/23/22 [id number]

## 2022-02-28 IMAGING — CR DG SACRUM/COCCYX 2+V
3 series · 3 of 3 positions shown · non-contrast
Comparison: None.

CLINICAL DATA: Sacral and coccygeal pain for approximately 1 month.

EXAM:
SACRUM AND COCCYX - 2+ VIEW

[t sacrum a.p. * (1 of 2)]
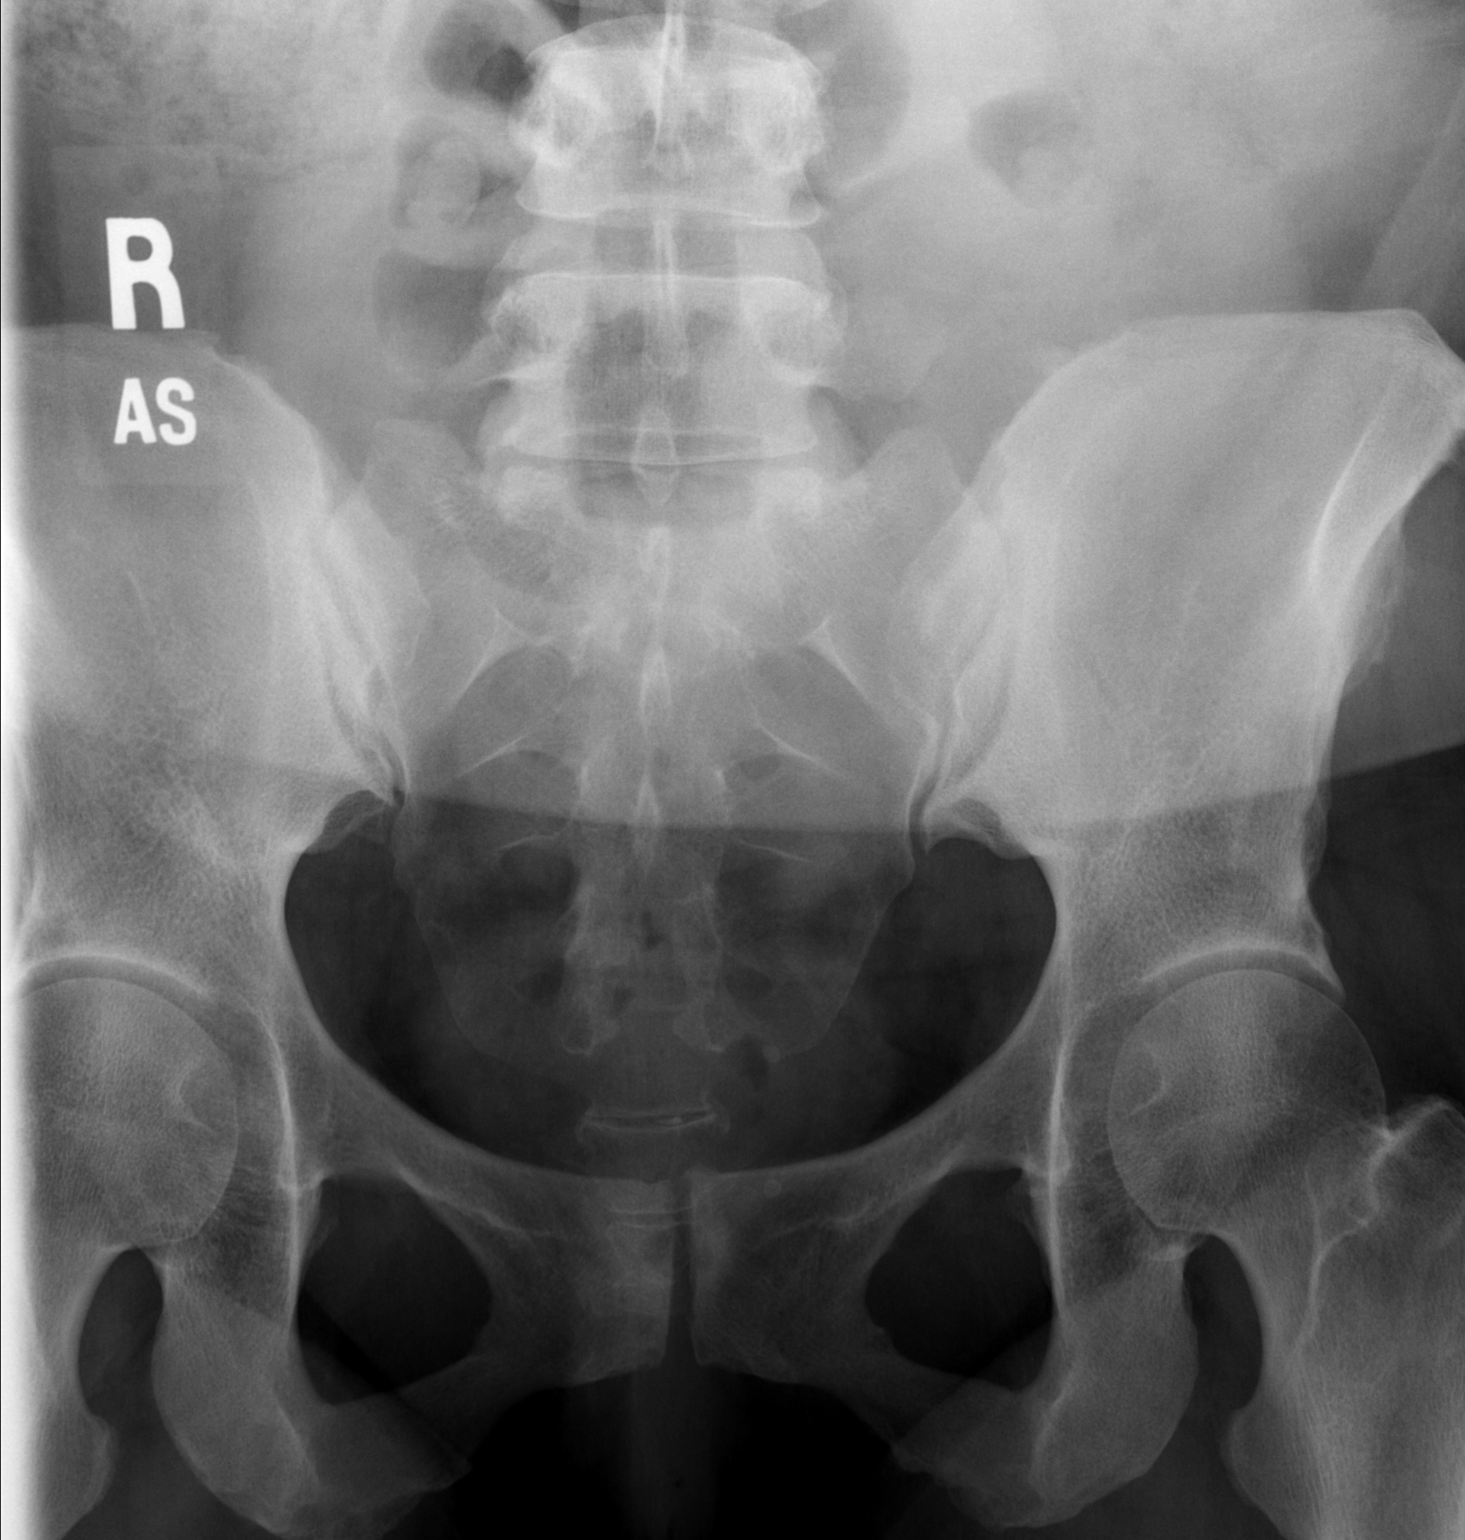

[t sacrum a.p. * (2 of 2)]
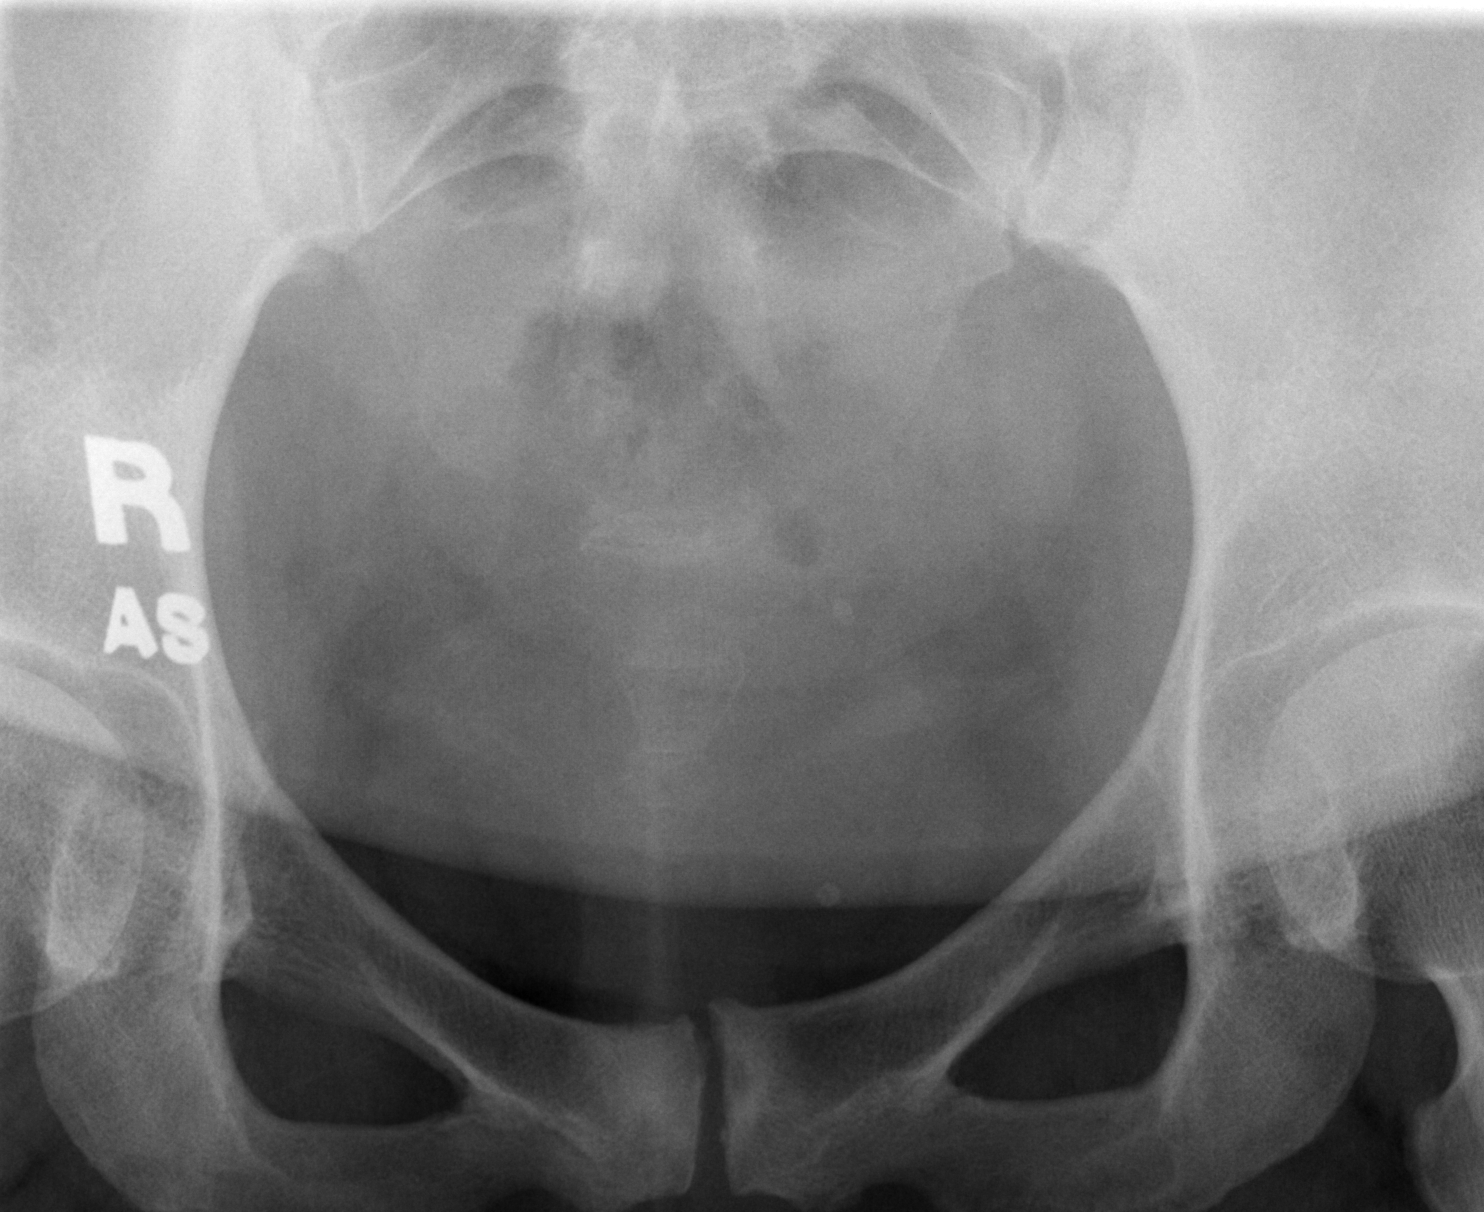

[t sacrum lat *]
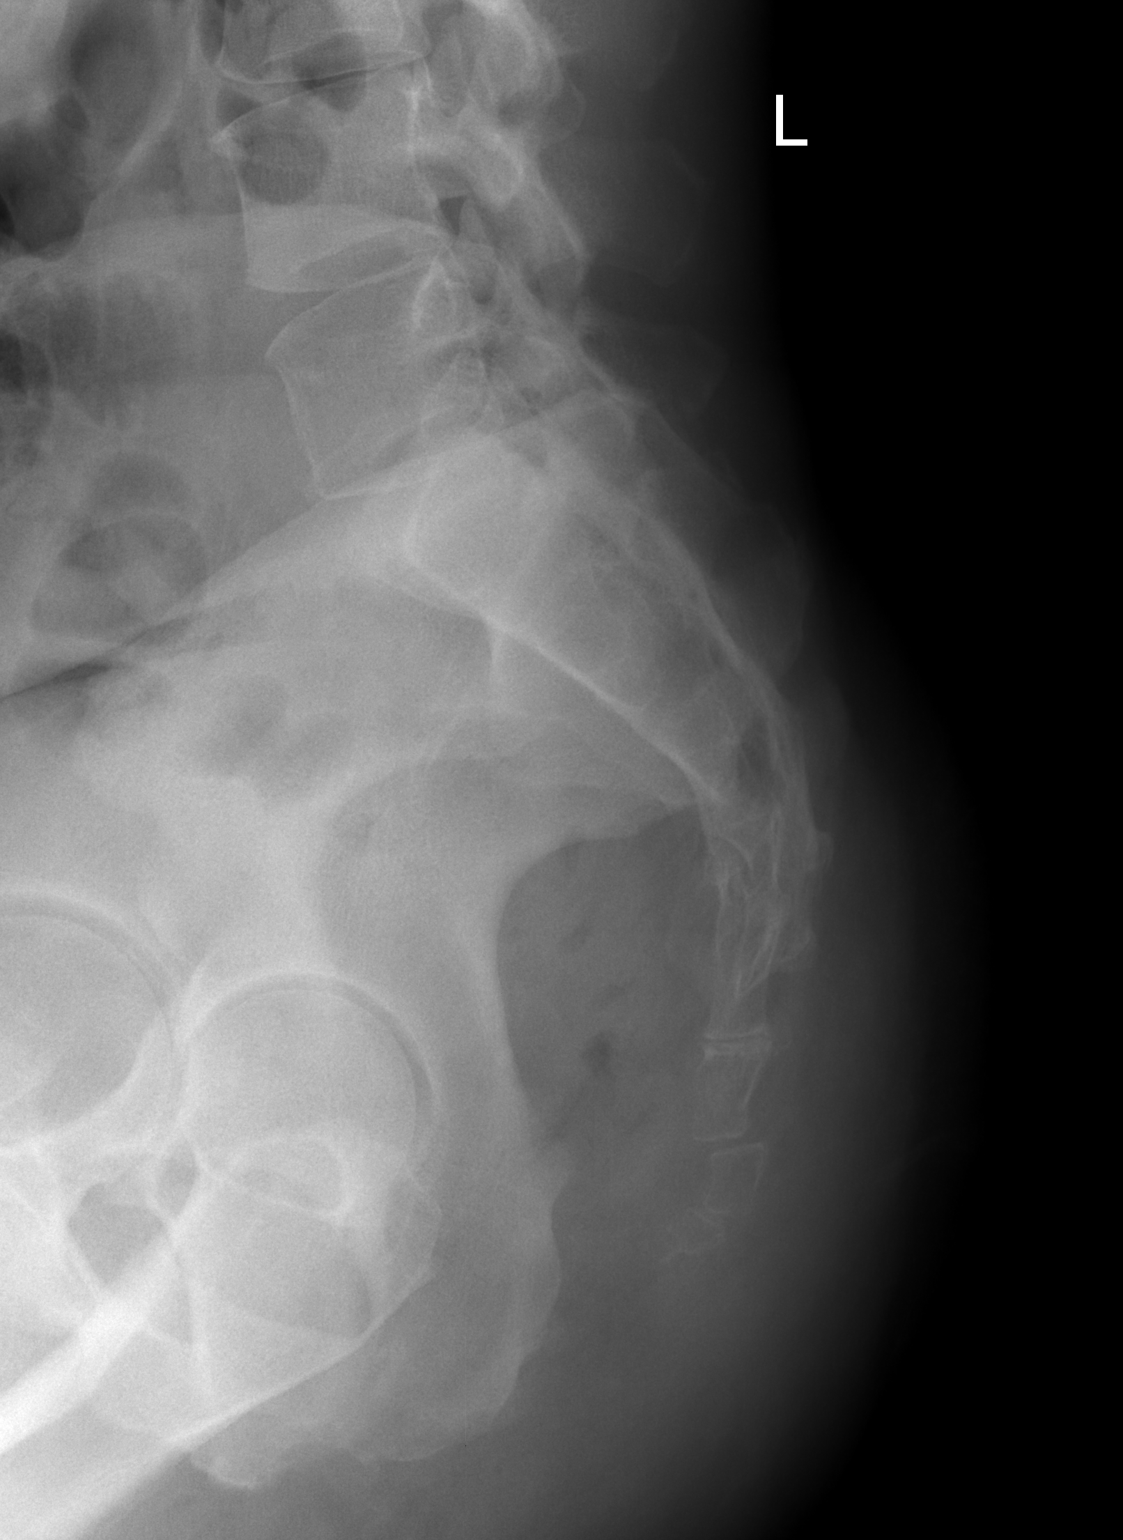

[3 of 3 positions shown; findings below may reference images not displayed]

FINDINGS: No fracture is identified. 0.2 cm dorsal displacement of the second
coccygeal segment is likely chronic. Soft tissues are unremarkable.
IMPRESSION: No acute abnormality. Mild dorsal displacement the second coccygeal
segment is likely chronic.

## 2022-03-11 ENCOUNTER — Ambulatory Visit: Payer: Commercial Managed Care - PPO | Admitting: Physician Assistant

## 2022-03-13 ENCOUNTER — Encounter: Payer: Self-pay | Admitting: Physician Assistant

## 2022-03-13 ENCOUNTER — Ambulatory Visit (INDEPENDENT_AMBULATORY_CARE_PROVIDER_SITE_OTHER): Payer: Commercial Managed Care - PPO | Admitting: Physician Assistant

## 2022-03-13 VITALS — BP 142/92 | HR 79 | Temp 97.8°F | Ht 65.0 in | Wt 201.0 lb

## 2022-03-13 DIAGNOSIS — D649 Anemia, unspecified: Secondary | ICD-10-CM

## 2022-03-13 DIAGNOSIS — N926 Irregular menstruation, unspecified: Secondary | ICD-10-CM

## 2022-03-13 DIAGNOSIS — Z13220 Encounter for screening for lipoid disorders: Secondary | ICD-10-CM

## 2022-03-13 DIAGNOSIS — Z862 Personal history of diseases of the blood and blood-forming organs and certain disorders involving the immune mechanism: Secondary | ICD-10-CM

## 2022-03-13 DIAGNOSIS — Z131 Encounter for screening for diabetes mellitus: Secondary | ICD-10-CM | POA: Diagnosis not present

## 2022-03-13 DIAGNOSIS — I10 Essential (primary) hypertension: Secondary | ICD-10-CM | POA: Diagnosis not present

## 2022-03-13 DIAGNOSIS — Z23 Encounter for immunization: Secondary | ICD-10-CM

## 2022-03-13 LAB — COMPREHENSIVE METABOLIC PANEL
ALT: 21 U/L (ref 0–35)
AST: 21 U/L (ref 0–37)
Albumin: 4.3 g/dL (ref 3.5–5.2)
Alkaline Phosphatase: 54 U/L (ref 39–117)
BUN: 10 mg/dL (ref 6–23)
CO2: 26 mEq/L (ref 19–32)
Calcium: 8.8 mg/dL (ref 8.4–10.5)
Chloride: 104 mEq/L (ref 96–112)
Creatinine, Ser: 0.77 mg/dL (ref 0.40–1.20)
GFR: 94.58 mL/min (ref >60.00)
Glucose, Bld: 96 mg/dL (ref 70–99)
Potassium: 4 mEq/L (ref 3.5–5.1)
Sodium: 136 mEq/L (ref 135–145)
Total Bilirubin: 0.5 mg/dL (ref 0.2–1.2)
Total Protein: 7.1 g/dL (ref 6.0–8.3)

## 2022-03-13 LAB — LIPID PANEL
Cholesterol: 245 mg/dL — ABNORMAL HIGH (ref 0–200)
HDL: 44.1 mg/dL (ref >39.00)
LDL Cholesterol: 177 mg/dL — ABNORMAL HIGH (ref 0–99)
NonHDL: 201.1
Total CHOL/HDL Ratio: 6
Triglycerides: 121 mg/dL (ref 0.0–149.0)
VLDL: 24.2 mg/dL (ref 0.0–40.0)

## 2022-03-13 LAB — CBC WITH DIFFERENTIAL/PLATELET
Basophils Absolute: 0.1 10*3/uL (ref 0.0–0.1)
Basophils Relative: 1.4 % (ref 0.0–3.0)
Eosinophils Absolute: 0.2 10*3/uL (ref 0.0–0.7)
Eosinophils Relative: 2.9 % (ref 0.0–5.0)
HCT: 26.6 % — ABNORMAL LOW (ref 36.0–46.0)
Hemoglobin: 8.1 g/dL — ABNORMAL LOW (ref 12.0–15.0)
Lymphocytes Relative: 24.4 % (ref 12.0–46.0)
Lymphs Abs: 1.8 10*3/uL (ref 0.7–4.0)
MCHC: 30.4 g/dL (ref 30.0–36.0)
MCV: 62.6 fl — ABNORMAL LOW (ref 78.0–100.0)
Monocytes Absolute: 0.5 10*3/uL (ref 0.1–1.0)
Monocytes Relative: 6.3 % (ref 3.0–12.0)
Neutro Abs: 4.8 10*3/uL (ref 1.4–7.7)
Neutrophils Relative %: 65 % (ref 43.0–77.0)
Platelets: 295 10*3/uL (ref 150.0–400.0)
RBC: 4.25 Mil/uL (ref 3.87–5.11)
RDW: 18.8 % — ABNORMAL HIGH (ref 11.5–15.5)
WBC: 7.4 10*3/uL (ref 4.0–10.5)

## 2022-03-13 LAB — HEMOGLOBIN A1C: Hgb A1c MFr Bld: 6.2 % (ref 4.6–6.5)

## 2022-03-13 LAB — TSH: TSH: 1.29 u[IU]/mL (ref 0.35–5.50)

## 2022-03-13 MED ORDER — METOPROLOL SUCCINATE ER 200 MG PO TB24: 200.0000 mg | ORAL_TABLET | Freq: Every day | ORAL | 1 refills | Status: AC

## 2022-03-13 MED ORDER — HYDROCHLOROTHIAZIDE 12.5 MG PO TABS: 12.5000 mg | ORAL_TABLET | Freq: Every day | ORAL | 0 refills | Status: DC

## 2022-03-13 NOTE — Progress Notes (Signed)
Subjective:    Patient ID: Annette Ford, female    DOB: November 02, 1980, 41 y.o.   MRN: [id number]  Chief Complaint  Patient presents with   Hypertension    Pt in for BP recheck and fasting labs; showing in need of TDAP nothing showing in [facility], will contact OBGYN again to attempt to get records    HPI Patient is in today for BP recheck. Not able to check at home due to broken machine.  Increased amlodipine to 10 mg at last visit. Some days noticing headaches when BP seems to be running higher. Also taking Toprol XL 200 mg daily.   Menstrual cycle is coming twice per month, usually lasting about 5 days. Hx PCOS.   Past Medical History:  Diagnosis Date   Hyperlipidemia 01/03/2021   Hypertension    PCOS (polycystic ovarian syndrome)    PCOS (polycystic ovarian syndrome)     Past Surgical History:  Procedure Laterality Date   CESAREAN SECTION     CHOLECYSTECTOMY     TUBAL LIGATION      Family History  Problem Relation Age of Onset   Alcohol abuse Mother    COPD Mother        cause of death   Depression Mother    Diabetes Mother    Drug abuse Mother    Mental illness Mother    Hypertension Mother    Schizophrenia Mother    Anxiety disorder Mother    Congestive Heart Failure Mother    Alcohol abuse Father    Drug abuse Father    Stroke Father        cause of death - heat stroke   Mental illness Father    Depression Father    Anxiety disorder Father    Bipolar disorder Father    Hypertension Sister    Diabetes Maternal Grandmother    Hypertension Maternal Grandmother    Diabetes Maternal Grandfather    COPD Maternal Grandfather    Hypertension Maternal Grandfather    Heart disease Paternal Grandmother    Hypertension Paternal Grandmother    Diabetes Paternal Grandfather    Hypertension Paternal Grandfather    Cancer Maternal Aunt    Diabetes Paternal Aunt     Social History   Tobacco Use   Smoking status: Never   Smokeless tobacco: Never  Vaping  Use   Vaping Use: Never used  Substance Use Topics   Alcohol use: Yes    Comment: occasionally   Drug use: No     No Known Allergies  Review of Systems NEGATIVE UNLESS OTHERWISE INDICATED IN HPI      Objective:     BP (!) 142/92 (BP Location: Left Arm)   Pulse 79   Temp 97.8 F (36.6 C) (Temporal)   Ht 5\' 5"  (1.651 m)   Wt 201 lb (91.2 kg)   SpO2 99%   BMI 33.45 kg/m   Wt Readings from Last 3 Encounters:  03/13/22 201 lb (91.2 kg)  02/18/22 203 lb 3.2 oz (92.2 kg)  12/22/18 180 lb (81.6 kg)    BP Readings from Last 3 Encounters:  03/13/22 (!) 142/92  02/19/22 (!) 156/91  02/18/22 (!) 166/110     Physical Exam Vitals and nursing note reviewed.  Constitutional:      Appearance: Normal appearance. She is normal weight. She is not toxic-appearing.  HENT:     Head: Normocephalic and atraumatic.  Eyes:     Extraocular Movements: Extraocular movements intact.  Conjunctiva/sclera: Conjunctivae normal.     Pupils: Pupils are equal, round, and reactive to light.  Cardiovascular:     Rate and Rhythm: Normal rate and regular rhythm.     Pulses: Normal pulses.     Heart sounds: Normal heart sounds.  Pulmonary:     Effort: Pulmonary effort is normal.     Breath sounds: Normal breath sounds.  Abdominal:     General: Abdomen is flat. Bowel sounds are normal.     Palpations: Abdomen is soft.  Musculoskeletal:        General: Normal range of motion.     Cervical back: Normal range of motion and neck supple.  Skin:    General: Skin is warm and dry.  Neurological:     General: No focal deficit present.     Mental Status: She is alert and oriented to person, place, and time.  Psychiatric:        Mood and Affect: Mood normal.        Behavior: Behavior normal.        Assessment & Plan:  Essential hypertension Assessment & Plan: Elevated above goal Cont amlodipine 10 mg daily Cont toprol XL 200 mg daily Add HCTZ 12.5 mg daily She will get a cuff for  monitoring at home Limit salt Increase exercise   Orders: -     TSH -     Lipid panel -     Comprehensive metabolic panel -     CBC with Differential/Platelet  History of anemia Assessment & Plan: Per stated, recheck labs   Orders: -     CBC with Differential/Platelet  Irregular menstruation Assessment & Plan: Per stated; Will check labs and refer to GYN at this time   Orders: -     Ambulatory referral to Gynecology -     FSH/LH  Low hemoglobin -     CBC with Differential/Platelet; Future -     POCT urinalysis dipstick; Future -     Fecal occult blood, imunochemical; Future  Need for prophylactic vaccination with combined diphtheria-tetanus-pertussis (DTP) vaccine  Diabetes mellitus screening -     Hemoglobin A1c -     Comprehensive metabolic panel  Screening for cholesterol level -     Lipid panel  Other orders -     hydroCHLOROthiazide; Take 1 tablet (12.5 mg total) by mouth daily.  Dispense: 30 tablet; Refill: 0 -     Metoprolol Succinate ER; Take 1 tablet (200 mg total) by mouth daily.  Dispense: 90 tablet; Refill: 1        Return in about 4 weeks (around 04/10/2022) for BP recheck.  This note was prepared with assistance of Conservation officer, historic buildings. Occasional wrong-word or sound-a-like substitutions may have occurred due to the inherent limitations of voice recognition software.     Paton Crum M Naliah Eddington, PA-C

## 2022-03-14 ENCOUNTER — Other Ambulatory Visit: Payer: Self-pay | Admitting: Physician Assistant

## 2022-03-14 DIAGNOSIS — D649 Anemia, unspecified: Secondary | ICD-10-CM

## 2022-03-14 LAB — FSH/LH
FSH: 11.3 m[IU]/mL
LH: 6.7 m[IU]/mL

## 2022-03-15 ENCOUNTER — Other Ambulatory Visit (INDEPENDENT_AMBULATORY_CARE_PROVIDER_SITE_OTHER): Payer: Commercial Managed Care - PPO

## 2022-03-15 ENCOUNTER — Telehealth: Payer: Self-pay

## 2022-03-15 ENCOUNTER — Other Ambulatory Visit: Payer: Commercial Managed Care - PPO

## 2022-03-15 ENCOUNTER — Other Ambulatory Visit: Payer: Self-pay

## 2022-03-15 DIAGNOSIS — D649 Anemia, unspecified: Secondary | ICD-10-CM

## 2022-03-15 LAB — CBC WITH DIFFERENTIAL/PLATELET
Basophils Absolute: 0.1 10*3/uL (ref 0.0–0.1)
Basophils Relative: 1.9 % (ref 0.0–3.0)
Eosinophils Absolute: 0.2 10*3/uL (ref 0.0–0.7)
Eosinophils Relative: 2.9 % (ref 0.0–5.0)
HCT: 26 % — ABNORMAL LOW (ref 36.0–46.0)
Hemoglobin: 8 g/dL — CL (ref 12.0–15.0)
Lymphocytes Relative: 27.7 % (ref 12.0–46.0)
Lymphs Abs: 1.8 10*3/uL (ref 0.7–4.0)
MCHC: 30.7 g/dL (ref 30.0–36.0)
MCV: 62.3 fl — ABNORMAL LOW (ref 78.0–100.0)
Monocytes Absolute: 0.5 10*3/uL (ref 0.1–1.0)
Monocytes Relative: 8.3 % (ref 3.0–12.0)
Neutro Abs: 3.9 10*3/uL (ref 1.4–7.7)
Neutrophils Relative %: 59.2 % (ref 43.0–77.0)
Platelets: 258 10*3/uL (ref 150.0–400.0)
RBC: 4.17 Mil/uL (ref 3.87–5.11)
RDW: 19 % — ABNORMAL HIGH (ref 11.5–15.5)
WBC: 6.5 10*3/uL (ref 4.0–10.5)

## 2022-03-15 LAB — URINALYSIS, ROUTINE W REFLEX MICROSCOPIC
Bilirubin Urine: NEGATIVE
Hgb urine dipstick: NEGATIVE
Ketones, ur: NEGATIVE
Leukocytes,Ua: NEGATIVE
Nitrite: NEGATIVE
Specific Gravity, Urine: 1.01 (ref 1.000–1.030)
Total Protein, Urine: 30 — AB
Urine Glucose: NEGATIVE
Urobilinogen, UA: 1 (ref 0.0–1.0)
pH: 8.5 — AB (ref 5.0–8.0)

## 2022-03-15 NOTE — Telephone Encounter (Signed)
CRITICAL: Hemoglobin 8.0

## 2022-03-18 DIAGNOSIS — N926 Irregular menstruation, unspecified: Secondary | ICD-10-CM | POA: Insufficient documentation

## 2022-03-18 NOTE — Assessment & Plan Note (Addendum)
Elevated above goal Cont amlodipine 10 mg daily Cont toprol XL 200 mg daily Add HCTZ 12.5 mg daily She will get a cuff for monitoring at home Limit salt Increase exercise

## 2022-03-18 NOTE — Assessment & Plan Note (Signed)
Per stated; Will check labs and refer to GYN at this time

## 2022-03-18 NOTE — Assessment & Plan Note (Signed)
Per stated, recheck labs

## 2022-03-19 ENCOUNTER — Telehealth: Payer: Self-pay | Admitting: Physician Assistant

## 2022-03-19 ENCOUNTER — Other Ambulatory Visit: Payer: Self-pay

## 2022-03-19 DIAGNOSIS — D649 Anemia, unspecified: Secondary | ICD-10-CM

## 2022-03-19 DIAGNOSIS — R809 Proteinuria, unspecified: Secondary | ICD-10-CM

## 2022-03-19 NOTE — Telephone Encounter (Signed)
See below, ok to order stool cards?

## 2022-03-19 NOTE — Telephone Encounter (Signed)
Pt needs an order for stool samples and she would also like for Alyssa to call her at her convenience.

## 2022-03-19 NOTE — Telephone Encounter (Signed)
Noted  

## 2022-03-20 ENCOUNTER — Other Ambulatory Visit (INDEPENDENT_AMBULATORY_CARE_PROVIDER_SITE_OTHER): Payer: Commercial Managed Care - PPO

## 2022-03-20 DIAGNOSIS — R809 Proteinuria, unspecified: Secondary | ICD-10-CM | POA: Diagnosis not present

## 2022-03-20 LAB — URINALYSIS, ROUTINE W REFLEX MICROSCOPIC
Bilirubin Urine: NEGATIVE
Hgb urine dipstick: NEGATIVE
Ketones, ur: NEGATIVE
Leukocytes,Ua: NEGATIVE
Nitrite: NEGATIVE
RBC / HPF: NONE SEEN (ref 0–?)
Specific Gravity, Urine: 1.015 (ref 1.000–1.030)
Total Protein, Urine: NEGATIVE
Urine Glucose: NEGATIVE
Urobilinogen, UA: 0.2 (ref 0.0–1.0)
pH: 7.5 (ref 5.0–8.0)

## 2022-03-20 NOTE — Telephone Encounter (Signed)
Patient picked up stool cards yesterday. Patient then called back with concerns about upcoming appt at the cancer center. Explained to patient that oncologist are hematologist and that we did not think she has cancer, that is just where her specialist works. Explained that Alyssa is running all of these tests to r/o where blood lost may be occurring. Patient did state that she has heavy menstrual cycles twice a month. Will complete stool cards and return it to the office today.

## 2022-03-20 NOTE — Telephone Encounter (Signed)
Noted and agreed, thank you. 

## 2022-03-21 ENCOUNTER — Inpatient Hospital Stay: Payer: Commercial Managed Care - PPO | Attending: Hematology & Oncology

## 2022-03-21 ENCOUNTER — Other Ambulatory Visit: Payer: Self-pay

## 2022-03-21 ENCOUNTER — Encounter: Payer: Self-pay | Admitting: Hematology & Oncology

## 2022-03-21 ENCOUNTER — Inpatient Hospital Stay (HOSPITAL_BASED_OUTPATIENT_CLINIC_OR_DEPARTMENT_OTHER): Payer: Commercial Managed Care - PPO | Admitting: Hematology & Oncology

## 2022-03-21 VITALS — BP 143/89 | HR 80 | Temp 98.8°F | Resp 18 | Ht 65.0 in | Wt 202.0 lb

## 2022-03-21 DIAGNOSIS — Z862 Personal history of diseases of the blood and blood-forming organs and certain disorders involving the immune mechanism: Secondary | ICD-10-CM

## 2022-03-21 DIAGNOSIS — D509 Iron deficiency anemia, unspecified: Secondary | ICD-10-CM | POA: Diagnosis present

## 2022-03-21 DIAGNOSIS — D500 Iron deficiency anemia secondary to blood loss (chronic): Secondary | ICD-10-CM

## 2022-03-21 LAB — CBC WITH DIFFERENTIAL (CANCER CENTER ONLY)
Abs Immature Granulocytes: 0.01 10*3/uL (ref 0.00–0.07)
Basophils Absolute: 0.1 10*3/uL (ref 0.0–0.1)
Basophils Relative: 1 %
Eosinophils Absolute: 0.2 10*3/uL (ref 0.0–0.5)
Eosinophils Relative: 2 %
HCT: 29.5 % — ABNORMAL LOW (ref 36.0–46.0)
Hemoglobin: 8.5 g/dL — ABNORMAL LOW (ref 12.0–15.0)
Immature Granulocytes: 0 %
Lymphocytes Relative: 28 %
Lymphs Abs: 2.4 10*3/uL (ref 0.7–4.0)
MCH: 19.2 pg — ABNORMAL LOW (ref 26.0–34.0)
MCHC: 28.8 g/dL — ABNORMAL LOW (ref 30.0–36.0)
MCV: 66.6 fL — ABNORMAL LOW (ref 80.0–100.0)
Monocytes Absolute: 0.5 10*3/uL (ref 0.1–1.0)
Monocytes Relative: 5 %
Neutro Abs: 5.4 10*3/uL (ref 1.7–7.7)
Neutrophils Relative %: 64 %
Platelet Count: 254 10*3/uL (ref 150–400)
RBC: 4.43 MIL/uL (ref 3.87–5.11)
RDW: 20.1 % — ABNORMAL HIGH (ref 11.5–15.5)
Smear Review: NORMAL
WBC Count: 8.6 10*3/uL (ref 4.0–10.5)
nRBC: 0 % (ref 0.0–0.2)

## 2022-03-21 LAB — RETICULOCYTES
Immature Retic Fract: 26.9 % — ABNORMAL HIGH (ref 2.3–15.9)
RBC.: 4.46 MIL/uL (ref 3.87–5.11)
Retic Count, Absolute: 64.7 10*3/uL (ref 19.0–186.0)
Retic Ct Pct: 1.5 % (ref 0.4–3.1)

## 2022-03-21 LAB — CMP (CANCER CENTER ONLY)
ALT: 18 U/L (ref 0–44)
AST: 16 U/L (ref 15–41)
Albumin: 4.5 g/dL (ref 3.5–5.0)
Alkaline Phosphatase: 52 U/L (ref 38–126)
Anion gap: 8 (ref 5–15)
BUN: 11 mg/dL (ref 6–20)
CO2: 25 mmol/L (ref 22–32)
Calcium: 9 mg/dL (ref 8.9–10.3)
Chloride: 104 mmol/L (ref 98–111)
Creatinine: 0.9 mg/dL (ref 0.44–1.00)
GFR, Estimated: >60 mL/min (ref >60)
Glucose, Bld: 107 mg/dL — ABNORMAL HIGH (ref 70–99)
Potassium: 3.9 mmol/L (ref 3.5–5.1)
Sodium: 137 mmol/L (ref 135–145)
Total Bilirubin: 0.4 mg/dL (ref 0.3–1.2)
Total Protein: 7.2 g/dL (ref 6.5–8.1)

## 2022-03-21 LAB — SAVE SMEAR(SSMR), FOR PROVIDER SLIDE REVIEW

## 2022-03-21 LAB — FERRITIN: Ferritin: 5 ng/mL — ABNORMAL LOW (ref 11–307)

## 2022-03-21 MED ORDER — FUSION PLUS PO CAPS: 1.0000 | ORAL_CAPSULE | Freq: Every day | ORAL | 6 refills | Status: DC

## 2022-03-21 NOTE — Progress Notes (Signed)
Referral MD  Reason for Referral: Microcytic anemia-likely iron deficiency secondary to menorrhagia  Chief Complaint  Patient presents with   New Patient (Initial Visit)  : I am just tired.  My blood is low.  HPI: Ms. Annette Ford is a very charming 41 year old Afro-American woman.  She is a Child psychotherapist.  I give her a lot of credit for being a Child psychotherapist.  She has 3 children.  She has twins that were born 2 years ago.  She says that when she had her twins, she was already anemic.  She thinks that the anemia just really never got that much better.  She does have frequent cycles.  She says she has to cycles a month.  She does chew ice a lot.  She has no problems with mouth sores.  She has had no swallowing difficulties.  There is no history of sickle cell.  As far she knows, there is no history of anemia in the family.  She has had C-sections.  She had her gallbladder taken out I think about 7 years ago.  She has had no change in bowel or bladder habits.  She says that she had her stool checked recently.  She does not know the results.  She had lab work done on 03/13/2022.  This showed a white cell count of 7.4.  Hemoglobin 8.1.  Platelet count 295,000.  Her MCV was 63.  Surprising, I do not have any iron results that were taken.  However, she was told to take some over-the-counter iron.  She is doing this.  She is doing well with this.  She has had no rashes.  There is been occasional leg swelling.  She has had no arthralgias or myalgias.  She has had no cough or shortness of breath.  She does feel fatigued.  She has had no problems with COVID.  I think she had COVID back in 2021.  She is not a vegetarian.  She does not smoke.  She has rare alcohol use.  Currently, I would say performance status is probably ECOG 1.   Past Medical History:  Diagnosis Date   Hyperlipidemia 01/03/2021   Hypertension    PCOS (polycystic ovarian syndrome)    PCOS (polycystic ovarian syndrome)    :   Past Surgical History:  Procedure Laterality Date   CESAREAN SECTION     CHOLECYSTECTOMY     TUBAL LIGATION    :   Current Outpatient Medications:    amLODipine (NORVASC) 10 MG tablet, Take 1 tablet (10 mg total) by mouth daily., Disp: 30 tablet, Rfl: 0   hydrochlorothiazide (HYDRODIURIL) 12.5 MG tablet, Take 1 tablet (12.5 mg total) by mouth daily., Disp: 30 tablet, Rfl: 0   Iron-FA-B Cmp-C-Biot-Probiotic (FUSION PLUS) CAPS, Take 1 capsule by mouth daily after breakfast., Disp: 30 capsule, Rfl: 6   metoprolol (TOPROL XL) 200 MG 24 hr tablet, Take 1 tablet (200 mg total) by mouth daily., Disp: 90 tablet, Rfl: 1:  :  No Known Allergies:   Family History  Problem Relation Age of Onset   Alcohol abuse Mother    COPD Mother        cause of death   Depression Mother    Diabetes Mother    Drug abuse Mother    Mental illness Mother    Hypertension Mother    Schizophrenia Mother    Anxiety disorder Mother    Congestive Heart Failure Mother    Alcohol abuse Father    Drug  abuse Father    Stroke Father        cause of death - heat stroke   Mental illness Father    Depression Father    Anxiety disorder Father    Bipolar disorder Father    Hypertension Sister    Diabetes Maternal Grandmother    Hypertension Maternal Grandmother    Diabetes Maternal Grandfather    COPD Maternal Grandfather    Hypertension Maternal Grandfather    Heart disease Paternal Grandmother    Hypertension Paternal Grandmother    Diabetes Paternal Grandfather    Hypertension Paternal Grandfather    Cancer Maternal Aunt    Diabetes Paternal Aunt   :   Social History   Socioeconomic History   Marital status: Married    Spouse name: Not on file   Number of children: 3   Years of education: Not on file   Highest education level: Not on file  Occupational History   Not on file  Tobacco Use   Smoking status: Never   Smokeless tobacco: Never  Vaping Use   Vaping Use: Never used   Substance and Sexual Activity   Alcohol use: Yes    Comment: occasionally   Drug use: No   Sexual activity: Yes    Birth control/protection: Surgical  Other Topics Concern   Not on file  Social History Narrative   Not on file   Social Determinants of Health   Financial Resource Strain: Not on file  Food Insecurity: Not on file  Transportation Needs: Not on file  Physical Activity: Not on file  Stress: Not on file  Social Connections: Not on file  Intimate Partner Violence: Not on file  :  Review of Systems  Constitutional: Negative.   HENT: Negative.    Eyes: Negative.   Respiratory: Negative.    Cardiovascular: Negative.   Gastrointestinal: Negative.   Genitourinary: Negative.   Musculoskeletal: Negative.   Skin: Negative.   Neurological: Negative.   Endo/Heme/Allergies: Negative.   Psychiatric/Behavioral: Negative.       Exam: Vital signs show temperature of 98.8.  Pulse 80.  Blood pressure 143/89.  Weight is 202 pounds.  @IPVITALS @ Physical Exam Vitals reviewed.  HENT:     Head: Normocephalic and atraumatic.  Eyes:     Pupils: Pupils are equal, round, and reactive to light.  Cardiovascular:     Rate and Rhythm: Normal rate and regular rhythm.     Heart sounds: Normal heart sounds.  Pulmonary:     Effort: Pulmonary effort is normal.     Breath sounds: Normal breath sounds.  Abdominal:     General: Bowel sounds are normal.     Palpations: Abdomen is soft.  Musculoskeletal:        General: No tenderness or deformity. Normal range of motion.     Cervical back: Normal range of motion.  Lymphadenopathy:     Cervical: No cervical adenopathy.  Skin:    General: Skin is warm and dry.     Findings: No erythema or rash.  Neurological:     Mental Status: She is alert and oriented to person, place, and time.  Psychiatric:        Behavior: Behavior normal.        Thought Content: Thought content normal.        Judgment: Judgment normal.     Recent Labs     03/21/22 1353  WBC 8.6  HGB 8.5*  HCT 29.5*  PLT 254  Recent Labs    03/21/22 1353  NA 137  K 3.9  CL 104  CO2 25  GLUCOSE 107*  BUN 11  CREATININE 0.90  CALCIUM 9.0    Blood smear review: Microcytic and hypochromic red blood cells.  There is some moderate anisocytosis and poikilocytosis.  There is no nucleated red blood cells.  I see no teardrop cells.  She has no schistocytes or spherocytes.  I see no rouleaux formation.  White blood cells.  Normal in morphology maturation.  She may have a couple hypersegmented polys.  There are no immature myeloid or lymphoid forms.  Platelets are adequate in number and size.  Pathology: None    Assessment and Plan: Ms. Maida Sale is a very nice 41 year old African-American female.  She has microcytic anemia.  This has to be iron deficiency.  Her blood smear is highly consistent with iron deficiency.  I do not see any evidence that she has a hemoglobinopathy.  Recommend that her reticulocyte count, when corrected is less than 1%.  This goes along with iron deficiency and not any type of hemolysis.  She really has not all that symptomatic with this.  As such, I think we can probably just keep her on oral iron.  However, I recommend that we try Fusion Plus.  I think this might be a better option for her for oral iron.  I will call in Fusion Plus for her.  I do not think she needs any x-ray studies.  I do not think he needs any bone marrow biopsies.  We really do not have to do anything invasive.  For right now, we will plan to get her back to see [place] about a month.  I think this would be very reasonable.  At that time, we should hopefully see a very nice rise in her MCV, and hopefully her hemoglobin is above 10.  If not, then we may have to think about IV iron.  She is incredibly delightful.  She has an incredibly important job as that being a Child psychotherapist.  I would much rather have her at home with her children at work helping  those who are less fortunate than here at the office.

## 2022-03-22 ENCOUNTER — Encounter: Payer: Self-pay | Admitting: *Deleted

## 2022-03-22 ENCOUNTER — Other Ambulatory Visit (INDEPENDENT_AMBULATORY_CARE_PROVIDER_SITE_OTHER): Payer: Commercial Managed Care - PPO

## 2022-03-22 DIAGNOSIS — D649 Anemia, unspecified: Secondary | ICD-10-CM | POA: Diagnosis not present

## 2022-03-22 LAB — IRON AND IRON BINDING CAPACITY (CC-WL,HP ONLY)
Iron: 23 ug/dL — ABNORMAL LOW (ref 28–170)
Saturation Ratios: 5 % — ABNORMAL LOW (ref 10.4–31.8)
TIBC: 437 ug/dL (ref 250–450)
UIBC: 414 ug/dL (ref 148–442)

## 2022-03-22 LAB — FECAL OCCULT BLOOD, IMMUNOCHEMICAL: Fecal Occult Bld: NEGATIVE

## 2022-03-22 LAB — ERYTHROPOIETIN: Erythropoietin: 62.7 m[IU]/mL — ABNORMAL HIGH (ref 2.6–18.5)

## 2022-03-26 LAB — HGB FRACTIONATION CASCADE
Hgb A2: 1.8 % (ref 1.8–3.2)
Hgb A: 98.2 % (ref 96.4–98.8)
Hgb F: 0 % (ref 0.0–2.0)
Hgb S: 0 %

## 2022-03-27 ENCOUNTER — Telehealth: Payer: Self-pay | Admitting: Physician Assistant

## 2022-03-27 NOTE — Telephone Encounter (Signed)
Pt would like a call back about her low iron. She would like to know what she can do or take because she is about to get her menstruation and is worried about that affecting her low iron. Please advise and call pt back with details.

## 2022-03-27 NOTE — Telephone Encounter (Signed)
Please see pt call notes and advise

## 2022-03-27 NOTE — Telephone Encounter (Signed)
Returned pt call and advised provider recommendations. Pt verbalized understanding

## 2022-04-03 ENCOUNTER — Ambulatory Visit
Admission: EM | Admit: 2022-04-03 | Discharge: 2022-04-03 | Disposition: A | Discharge status: Home / Self Care | Payer: Commercial Managed Care - PPO | Attending: Nurse Practitioner | Admitting: Nurse Practitioner

## 2022-04-03 ENCOUNTER — Ambulatory Visit (INDEPENDENT_AMBULATORY_CARE_PROVIDER_SITE_OTHER): Payer: Commercial Managed Care - PPO

## 2022-04-03 DIAGNOSIS — M79671 Pain in right foot: Secondary | ICD-10-CM

## 2022-04-03 DIAGNOSIS — M25571 Pain in right ankle and joints of right foot: Secondary | ICD-10-CM

## 2022-04-03 DIAGNOSIS — S93491A Sprain of other ligament of right ankle, initial encounter: Secondary | ICD-10-CM

## 2022-04-03 MED ORDER — IBUPROFEN 800 MG PO TABS: 800.0000 mg | ORAL_TABLET | Freq: Three times a day (TID) | ORAL | 0 refills | Status: AC | PRN

## 2022-04-03 NOTE — Discharge Instructions (Signed)
X-ray today does not show any broken bones in your ankle, lower leg, or foot.  It does show some calcaneal spurring which I believe is an incidental finding and not the cause of your pain today.  I believe you have sprained one of the ligaments in your right ankle.  We have applied an Ace wrap, please wear this is much as you can to help provide compression and stabilization to the ankle.  Try to rest, keep your foot/ankle elevated, apply ice 15 minutes on, 45 minutes off every hour while awake, and take Tylenol/ibuprofen as needed for pain.  If symptoms not improved within a couple of days, recommend following up with a foot and ankle doctor and contact information has been provided.

## 2022-04-03 NOTE — ED Provider Notes (Signed)
[facility]    CSN: [id number] Arrival date & time: 04/03/22  1345      History   Chief Complaint Chief Complaint  Patient presents with   Foot Pain   Ankle Pain    HPI Annette Ford is a 42 y.o. female.   Patient presents today for foot and ankle pain that began today.  Reports this morning, she and her daughter were chased by 2 dogs and she had to jump on top of a car to get away from them.  Denies any known injury to the ankle or foot at that time.  She went to work shortly after the incident and was in no pain.    While at work, she was able to walk and move around is normal.  Reports she sat down for lunch and when she went to get up, she could no longer bear weight on her right foot/ankle.  She also endorses pain without weightbearing on the right foot/ankle.  Reports right foot/ankle is swollen and tender to touch.  No fever or nausea/vomiting since the pain began.  Has not tried anything for symptoms so far.  Denies history of surgeries to the ankle.    Past Medical History:  Diagnosis Date   Hyperlipidemia 01/03/2021   Hypertension    PCOS (polycystic ovarian syndrome)    PCOS (polycystic ovarian syndrome)     Patient Active Problem List   Diagnosis Date Noted   Irregular menstruation 03/18/2022   Essential hypertension 02/18/2022   History of gestational diabetes 02/18/2022   History of anemia 02/18/2022   Hyperlipidemia 01/03/2021   S/P cesarean section 08/28/2018   HSV infection 04/08/2018   History of severe pre-eclampsia 03/05/2018   Obesity (BMI 30.0-34.9) 03/05/2018   Pregnancy, twins, delivered 03/05/2018    Past Surgical History:  Procedure Laterality Date   CESAREAN SECTION     CHOLECYSTECTOMY     TUBAL LIGATION      OB History   No obstetric history on file.      Home Medications    Prior to Admission medications   Medication Sig Start Date End Date Taking? Authorizing Provider  ibuprofen (ADVIL) 800 MG tablet  Take 1 tablet (800 mg total) by mouth every 8 (eight) hours as needed for mild pain or moderate pain. Take with food to prevent GI upset 04/03/22  Yes Noemi Chapel A, NP  amLODipine (NORVASC) 10 MG tablet Take 1 tablet (10 mg total) by mouth daily. 02/18/22 03/21/22  Allwardt, Randa Evens, PA-C  hydrochlorothiazide (HYDRODIURIL) 12.5 MG tablet Take 1 tablet (12.5 mg total) by mouth daily. 03/13/22   Allwardt, Randa Evens, PA-C  Iron-FA-B Cmp-C-Biot-Probiotic (FUSION PLUS) CAPS Take 1 capsule by mouth daily after breakfast. 03/21/22   Volanda Napoleon, MD  metoprolol (TOPROL XL) 200 MG 24 hr tablet Take 1 tablet (200 mg total) by mouth daily. 03/13/22   Allwardt, Randa Evens, PA-C  atorvastatin (LIPITOR) 10 MG tablet Take 10 mg by mouth daily. 03/07/20 08/30/20  [provider]  cetirizine (ZYRTEC ALLERGY) 10 MG tablet Take 1 tablet (10 mg total) by mouth daily. 03/23/20 08/03/20  Jaynee Eagles, PA-C  metoprolol tartrate (LOPRESSOR) 100 MG tablet Take 100 mg by mouth 2 (two) times daily.  08/30/20  [provider]    Family History Family History  Problem Relation Age of Onset   Alcohol abuse Mother    COPD Mother        cause of death   Depression Mother  Diabetes Mother    Drug abuse Mother    Mental illness Mother    Hypertension Mother    Schizophrenia Mother    Anxiety disorder Mother    Congestive Heart Failure Mother    Alcohol abuse Father    Drug abuse Father    Stroke Father        cause of death - heat stroke   Mental illness Father    Depression Father    Anxiety disorder Father    Bipolar disorder Father    Hypertension Sister    Diabetes Maternal Grandmother    Hypertension Maternal Grandmother    Diabetes Maternal Grandfather    COPD Maternal Grandfather    Hypertension Maternal Grandfather    Heart disease Paternal Grandmother    Hypertension Paternal Grandmother    Diabetes Paternal Grandfather    Hypertension Paternal Grandfather    Cancer Maternal  Aunt    Diabetes Paternal Aunt     Social History Social History   Tobacco Use   Smoking status: Never   Smokeless tobacco: Never  Vaping Use   Vaping Use: Never used  Substance Use Topics   Alcohol use: Yes    Comment: occasionally   Drug use: No     Allergies   Patient has no known allergies.   Review of Systems Review of Systems Per HPI  Physical Exam Triage Vital Signs ED Triage Vitals  Enc Vitals Group     BP 04/03/22 1404 (!) 146/98     Pulse Rate 04/03/22 1404 (!) 108     Resp 04/03/22 1404 16     Temp 04/03/22 1404 98.9 F (37.2 C)     Temp Source 04/03/22 1404 Oral     SpO2 04/03/22 1404 98 %     Weight --      Height --      Head Circumference --      Peak Flow --      Pain Score 04/03/22 1403 10     Pain Loc --      Pain Edu? --      Excl. in [place]? --    No data found.  Updated Vital Signs BP (!) 146/98 (BP Location: Right Arm)   Pulse (!) 108   Temp 98.9 F (37.2 C) (Oral)   Resp 16   LMP 03/26/2022 (Exact Date)   SpO2 98%   Visual Acuity Right Eye Distance:   Left Eye Distance:   Bilateral Distance:    Right Eye Near:   Left Eye Near:    Bilateral Near:     Physical Exam Vitals and nursing note reviewed.  Constitutional:      General: She is not in acute distress.    Appearance: Normal appearance. She is not toxic-appearing.  HENT:     Mouth/Throat:     Mouth: Mucous membranes are moist.     Pharynx: Oropharynx is clear.  Pulmonary:     Effort: Pulmonary effort is normal. No respiratory distress.  Musculoskeletal:     Right ankle: Swelling present. No deformity, ecchymosis or lacerations. Tenderness present over the lateral malleolus, ATF ligament and base of 5th metatarsal. No medial malleolus, AITF ligament, CF ligament, posterior TF ligament or proximal fibula tenderness. Normal range of motion. Anterior drawer test negative. Normal pulse.     Right Achilles Tendon: Normal. No tenderness.     Right foot: Normal range of  motion and normal capillary refill. Swelling present. No tenderness or bony tenderness. Normal  pulse.       Feet:     Comments: Inspection: Right lateral malleolus swelling; no bruising, obvious deformity, or redness.  No swelling appreciated to the right foot as well as no obvious deformity, redness, or bruising.  Palpation: Right lateral malleolus tender to palpation in approximately area marked along ATFL; there is also tenderness to palpation along the base of the fifth metatarsal and heel.  No obvious deformities palpated.   ROM: difficult to perform secondary to pain Strength: difficult to assess secondary to pain Neurovascular: neurovascularly intact in she is mildly tachycardic to right lower extremity    Skin:    General: Skin is warm and dry.     Capillary Refill: Capillary refill takes less than 2 seconds.     Coloration: Skin is not jaundiced or pale.     Findings: No erythema.  Neurological:     Mental Status: She is alert and oriented to person, place, and time.  Psychiatric:        Behavior: Behavior is cooperative.      UC Treatments / Results  Labs (all labs ordered are listed, but only abnormal results are displayed) Labs Reviewed - No data to display  EKG   Radiology DG Ankle Complete Right  Result Date: 04/03/2022 CLINICAL DATA:  Right foot and ankle pain.  No known injury EXAM: RIGHT ANKLE - COMPLETE 3+ VIEW COMPARISON:  None Available. FINDINGS: Normal alignment no fracture. Ankle joint space normal. Spurring of the distal tibia anteriorly Calcaneal spurring at the Achilles tendon and plantar fascia insertion sites. IMPRESSION: Calcaneal spurring. No acute abnormality. Electronically Signed   By: Marlan Palau M.D.   On: 04/03/2022 14:39   DG Foot Complete Right  Result Date: 04/03/2022 CLINICAL DATA:  Right foot and ankle pain no known injury EXAM: RIGHT FOOT COMPLETE - 3+ VIEW COMPARISON:  Right great toe 07/30/2021 FINDINGS: There is no evidence of  fracture or dislocation. There is no evidence of arthropathy or other focal bone abnormality. Soft tissues are unremarkable. Calcaneal spurring at the Achilles tendon insertion and the plantar fascia insertion. Mild anterior spurring distal tibia. IMPRESSION: Calcaneal spurring.  No acute abnormality. Electronically Signed   By: Marlan Palau M.D.   On: 04/03/2022 14:39    Procedures Procedures (including critical care time)  Medications Ordered in UC Medications - No data to display  Initial Impression / Assessment and Plan / UC Course  I have reviewed the triage vital signs and the nursing notes.  Pertinent labs & imaging results that were available during my care of the patient were reviewed by me and considered in my medical decision making (see chart for details).   Patient is well-appearing, normotensive, afebrile, not tachypneic, oxygenating well on room air.  She is mildly tachycardic today in triage, likely secondary to acute pain in right foot.  Sprain of anterior talofibular ligament of right ankle, initial encounter Right foot pain Acute right ankle pain X-rays of foot and ankle today are negative for acute bony abnormality; there is spurring and discussed this with the patient-this is not likely the cause of her pain Suspect sprain of ATFL Compression, ice, rest, and elevation recommended Patient placed in an Ace wrap, given crutches to use if she is unable to bear weight secondary to pain Can use Tylenol 500 to 1000 mg every 6 hours as needed for pain along with ibuprofen 800 mg every 8 hours as needed for pain Note given for work Follow-up with podiatry later this  week with no improvement or worsening of symptoms despite treatment  The patient was given the opportunity to ask questions.  All questions answered to their satisfaction.  The patient is in agreement to this plan.    Final Clinical Impressions(s) / UC Diagnoses   Final diagnoses:  Right foot pain  Acute right  ankle pain  Sprain of anterior talofibular ligament of right ankle, initial encounter     Discharge Instructions      X-ray today does not show any broken bones in your ankle, lower leg, or foot.  It does show some calcaneal spurring which I believe is an incidental finding and not the cause of your pain today.  I believe you have sprained one of the ligaments in your right ankle.  We have applied an Ace wrap, please wear this is much as you can to help provide compression and stabilization to the ankle.  Try to rest, keep your foot/ankle elevated, apply ice 15 minutes on, 45 minutes off every hour while awake, and take Tylenol/ibuprofen as needed for pain.  If symptoms not improved within a couple of days, recommend following up with a foot and ankle doctor and contact information has been provided.     ED Prescriptions     Medication Sig Dispense Auth. Provider   ibuprofen (ADVIL) 800 MG tablet Take 1 tablet (800 mg total) by mouth every 8 (eight) hours as needed for mild pain or moderate pain. Take with food to prevent GI upset 30 tablet Eulogio Bear, NP      PDMP not reviewed this encounter.   Eulogio Bear, NP 04/03/22 1536

## 2022-04-03 NOTE — ED Triage Notes (Signed)
Pt reports pain and swelling in right ankle and right foot since this morning after she run and jumped in to her car when she was chasing by dogs. Reports she can not put weight on right foot.

## 2022-04-10 ENCOUNTER — Encounter: Payer: Self-pay | Admitting: Physician Assistant

## 2022-04-10 ENCOUNTER — Ambulatory Visit (INDEPENDENT_AMBULATORY_CARE_PROVIDER_SITE_OTHER): Payer: Commercial Managed Care - PPO | Admitting: Podiatry

## 2022-04-10 ENCOUNTER — Encounter: Payer: Self-pay | Admitting: Podiatry

## 2022-04-10 ENCOUNTER — Ambulatory Visit (INDEPENDENT_AMBULATORY_CARE_PROVIDER_SITE_OTHER): Payer: Commercial Managed Care - PPO | Admitting: Physician Assistant

## 2022-04-10 ENCOUNTER — Ambulatory Visit (INDEPENDENT_AMBULATORY_CARE_PROVIDER_SITE_OTHER): Payer: Commercial Managed Care - PPO

## 2022-04-10 VITALS — BP 118/74 | HR 80 | Temp 97.7°F | Ht 65.0 in | Wt 206.2 lb

## 2022-04-10 DIAGNOSIS — M79671 Pain in right foot: Secondary | ICD-10-CM

## 2022-04-10 DIAGNOSIS — R5383 Other fatigue: Secondary | ICD-10-CM

## 2022-04-10 DIAGNOSIS — M79672 Pain in left foot: Secondary | ICD-10-CM | POA: Diagnosis not present

## 2022-04-10 DIAGNOSIS — N926 Irregular menstruation, unspecified: Secondary | ICD-10-CM | POA: Diagnosis not present

## 2022-04-10 DIAGNOSIS — E538 Deficiency of other specified B group vitamins: Secondary | ICD-10-CM | POA: Diagnosis not present

## 2022-04-10 DIAGNOSIS — D500 Iron deficiency anemia secondary to blood loss (chronic): Secondary | ICD-10-CM

## 2022-04-10 DIAGNOSIS — F439 Reaction to severe stress, unspecified: Secondary | ICD-10-CM

## 2022-04-10 DIAGNOSIS — I10 Essential (primary) hypertension: Secondary | ICD-10-CM

## 2022-04-10 DIAGNOSIS — E282 Polycystic ovarian syndrome: Secondary | ICD-10-CM | POA: Insufficient documentation

## 2022-04-10 MED ORDER — CYANOCOBALAMIN 1000 MCG/ML IJ SOLN
1000.0000 ug | Freq: Once | INTRAMUSCULAR | Status: AC
  Administered 2022-04-10: 1000 ug via INTRAMUSCULAR

## 2022-04-10 NOTE — Assessment & Plan Note (Signed)
Blood pressure stable.  Normotensive. Cont amlodipine 10 mg daily Cont toprol XL 200 mg daily Continue HCTZ 12.5 mg daily She will get a cuff for monitoring at home Limit salt Increase exercise

## 2022-04-10 NOTE — Assessment & Plan Note (Signed)
Referral to GYN 

## 2022-04-10 NOTE — Progress Notes (Signed)
Pt received b12 in right deltoid, pt tolerated well. 

## 2022-04-10 NOTE — Assessment & Plan Note (Addendum)
Works as a Education officer, museum.  She has 3 young kids at home.  Sleeping issues with the kids.  Generally just overworked and overtired; not resting well.  AVS provided to help.

## 2022-04-10 NOTE — Progress Notes (Signed)
Subjective:    Patient ID: Annette Ford, female    DOB: 10-04-1980, 42 y.o.   MRN: [id number]  Chief Complaint  Patient presents with   Hypertension    Pt wants to talk about being giving something for energy    Follow-up    HPI Patient is in today for recheck.   Feeling more tired than normal lately. Difficult to stay on task at work. She is taking Fusion Plus vitamin daily. Noticing that she's eating less ice.  Not sleeping well at night. Three kids sleeping with her. Twin 19 year olds and a now 44 year old.   LMP 03/26/2022 - lasted 5 days, not as heavy bleeding as it has been in past.  Usually gets two per month in the last 3 months (Oct - Dec).   Past Medical History:  Diagnosis Date   Hyperlipidemia 01/03/2021   Hypertension    PCOS (polycystic ovarian syndrome)    PCOS (polycystic ovarian syndrome)     Past Surgical History:  Procedure Laterality Date   CESAREAN SECTION     CHOLECYSTECTOMY     TUBAL LIGATION      Family History  Problem Relation Age of Onset   Alcohol abuse Mother    COPD Mother        cause of death   Depression Mother    Diabetes Mother    Drug abuse Mother    Mental illness Mother    Hypertension Mother    Schizophrenia Mother    Anxiety disorder Mother    Congestive Heart Failure Mother    Alcohol abuse Father    Drug abuse Father    Stroke Father        cause of death - heat stroke   Mental illness Father    Depression Father    Anxiety disorder Father    Bipolar disorder Father    Hypertension Sister    Diabetes Maternal Grandmother    Hypertension Maternal Grandmother    Diabetes Maternal Grandfather    COPD Maternal Grandfather    Hypertension Maternal Grandfather    Heart disease Paternal Grandmother    Hypertension Paternal Grandmother    Diabetes Paternal Grandfather    Hypertension Paternal Grandfather    Cancer Maternal Aunt    Diabetes Paternal Aunt     Social History   Tobacco Use   Smoking  status: Never   Smokeless tobacco: Never  Vaping Use   Vaping Use: Never used  Substance Use Topics   Alcohol use: Yes    Comment: occasionally   Drug use: No     No Known Allergies  Review of Systems NEGATIVE UNLESS OTHERWISE INDICATED IN HPI      Objective:     BP 118/74 (BP Location: Right Arm, Patient Position: Sitting)   Pulse 80   Temp 97.7 F (36.5 C) (Temporal)   Ht 5\' 5"  (1.651 m)   Wt 206 lb 3.2 oz (93.5 kg)   LMP 03/26/2022 (Exact Date)   SpO2 99%   BMI 34.31 kg/m   Wt Readings from Last 3 Encounters:  04/10/22 206 lb 3.2 oz (93.5 kg)  03/21/22 202 lb (91.6 kg)  03/13/22 201 lb (91.2 kg)    BP Readings from Last 3 Encounters:  04/10/22 118/74  04/03/22 (!) 146/98  03/21/22 (!) 143/89     Physical Exam Vitals and nursing note reviewed.  Constitutional:      Appearance: Normal appearance.  Cardiovascular:     Rate and  Rhythm: Normal rate and regular rhythm.     Pulses: Normal pulses.  Pulmonary:     Effort: Pulmonary effort is normal.     Breath sounds: Normal breath sounds.  Neurological:     General: No focal deficit present.     Mental Status: She is alert and oriented to person, place, and time.  Psychiatric:        Mood and Affect: Mood normal.        Behavior: Behavior normal.     Comments: Tired- appearing        Assessment & Plan:  Other fatigue -     Ambulatory referral to Psychology -     CBC with Differential/Platelet -     IBC + Ferritin -     Vitamin B12  Stress at home Assessment & Plan: Works as a Education officer, museum.  She has 3 young kids at home.  Sleeping issues with the kids.  Generally just overworked and overtired; not resting well.  AVS provided to help.  Orders: -     Ambulatory referral to Psychology  Irregular menstruation -     CBC with Differential/Platelet -     IBC + Ferritin -     Vitamin B12  PCOS (polycystic ovarian syndrome) Assessment & Plan: Referral to GYN.  Orders: -     CBC with  Differential/Platelet -     IBC + Ferritin -     Vitamin B12  B12 deficiency Assessment & Plan: Gave B12 injection in office today.  Hoping this will help with some of her energy as well.  Orders: -     Vitamin B12 -     Cyanocobalamin  Iron deficiency anemia due to chronic blood loss Assessment & Plan: Secondary to menstruation. I offered an order for pelvic ultrasound, but will relook at her previous referral to GYN and its status first.  She continues to follow with hematology.  She continues to take fusion plus vitamin.  Orders: -     CBC with Differential/Platelet -     IBC + Ferritin  Essential hypertension Assessment & Plan: Blood pressure stable.  Normotensive. Cont amlodipine 10 mg daily Cont toprol XL 200 mg daily Continue HCTZ 12.5 mg daily She will get a cuff for monitoring at home Limit salt Increase exercise        Return in about 3 months (around 07/10/2022) for recheck.  This note was prepared with assistance of Systems analyst. Occasional wrong-word or sound-a-like substitutions may have occurred due to the inherent limitations of voice recognition software.     Rasheed Welty M Yazen Rosko, PA-C
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
## 2022-04-10 NOTE — Assessment & Plan Note (Addendum)
Secondary to menstruation. I offered an order for pelvic ultrasound, but will relook at her previous referral to GYN and its status first.  She continues to follow with hematology.  She continues to take fusion plus vitamin.

## 2022-04-10 NOTE — Assessment & Plan Note (Signed)
Gave B12 injection in office today.  Hoping this will help with some of her energy as well.

## 2022-04-11 ENCOUNTER — Other Ambulatory Visit: Payer: Self-pay | Admitting: Physician Assistant

## 2022-04-11 LAB — IBC + FERRITIN
Ferritin: 60.7 ng/mL (ref 10.0–291.0)
Iron: 34 ug/dL — ABNORMAL LOW (ref 42–145)
Saturation Ratios: 8.4 % — ABNORMAL LOW (ref 20.0–50.0)
TIBC: 406 ug/dL (ref 250.0–450.0)
Transferrin: 290 mg/dL (ref 212.0–360.0)

## 2022-04-11 LAB — CBC WITH DIFFERENTIAL/PLATELET
Basophils Absolute: 0.2 10*3/uL — ABNORMAL HIGH (ref 0.0–0.1)
Basophils Relative: 1.7 % (ref 0.0–3.0)
Eosinophils Absolute: 0.3 10*3/uL (ref 0.0–0.7)
Eosinophils Relative: 2.7 % (ref 0.0–5.0)
HCT: 31.9 % — ABNORMAL LOW (ref 36.0–46.0)
Hemoglobin: 9.9 g/dL — ABNORMAL LOW (ref 12.0–15.0)
Lymphocytes Relative: 22.8 % (ref 12.0–46.0)
Lymphs Abs: 2.6 10*3/uL (ref 0.7–4.0)
MCHC: 31.2 g/dL (ref 30.0–36.0)
MCV: 70.2 fl — ABNORMAL LOW (ref 78.0–100.0)
Monocytes Absolute: 0.6 10*3/uL (ref 0.1–1.0)
Monocytes Relative: 5.5 % (ref 3.0–12.0)
Neutro Abs: 7.7 10*3/uL (ref 1.4–7.7)
Neutrophils Relative %: 67.3 % (ref 43.0–77.0)
Platelets: 398 10*3/uL (ref 150.0–400.0)
RBC: 4.54 Mil/uL (ref 3.87–5.11)
RDW: 29.5 % — ABNORMAL HIGH (ref 11.5–15.5)
WBC: 11.5 10*3/uL — ABNORMAL HIGH (ref 4.0–10.5)

## 2022-04-11 LAB — VITAMIN B12: Vitamin B-12: 369 pg/mL (ref 211–911)

## 2022-04-11 MED ORDER — HYDROCHLOROTHIAZIDE 12.5 MG PO TABS: 12.5000 mg | ORAL_TABLET | Freq: Every day | ORAL | 1 refills | Status: AC

## 2022-04-11 MED ORDER — AMLODIPINE BESYLATE 10 MG PO TABS: 10.0000 mg | ORAL_TABLET | Freq: Every day | ORAL | 1 refills | Status: AC

## 2022-04-18 ENCOUNTER — Encounter: Payer: Self-pay | Admitting: Medical Oncology

## 2022-04-18 ENCOUNTER — Encounter: Payer: Self-pay | Admitting: *Deleted

## 2022-04-18 ENCOUNTER — Other Ambulatory Visit: Payer: Self-pay

## 2022-04-18 ENCOUNTER — Inpatient Hospital Stay (HOSPITAL_BASED_OUTPATIENT_CLINIC_OR_DEPARTMENT_OTHER): Payer: Commercial Managed Care - PPO | Admitting: Medical Oncology

## 2022-04-18 ENCOUNTER — Inpatient Hospital Stay: Payer: Commercial Managed Care - PPO | Attending: Hematology & Oncology

## 2022-04-18 ENCOUNTER — Other Ambulatory Visit: Payer: Self-pay | Admitting: *Deleted

## 2022-04-18 VITALS — BP 126/84 | HR 73 | Temp 98.3°F | Resp 18 | Ht 65.0 in | Wt 207.4 lb

## 2022-04-18 DIAGNOSIS — R5383 Other fatigue: Secondary | ICD-10-CM

## 2022-04-18 DIAGNOSIS — Z862 Personal history of diseases of the blood and blood-forming organs and certain disorders involving the immune mechanism: Secondary | ICD-10-CM

## 2022-04-18 DIAGNOSIS — I10 Essential (primary) hypertension: Secondary | ICD-10-CM | POA: Insufficient documentation

## 2022-04-18 DIAGNOSIS — N920 Excessive and frequent menstruation with regular cycle: Secondary | ICD-10-CM | POA: Diagnosis not present

## 2022-04-18 DIAGNOSIS — D500 Iron deficiency anemia secondary to blood loss (chronic): Secondary | ICD-10-CM

## 2022-04-18 DIAGNOSIS — F439 Reaction to severe stress, unspecified: Secondary | ICD-10-CM

## 2022-04-18 DIAGNOSIS — N926 Irregular menstruation, unspecified: Secondary | ICD-10-CM | POA: Diagnosis not present

## 2022-04-18 DIAGNOSIS — D509 Iron deficiency anemia, unspecified: Secondary | ICD-10-CM | POA: Diagnosis present

## 2022-04-18 DIAGNOSIS — E282 Polycystic ovarian syndrome: Secondary | ICD-10-CM | POA: Diagnosis not present

## 2022-04-18 DIAGNOSIS — E538 Deficiency of other specified B group vitamins: Secondary | ICD-10-CM | POA: Diagnosis not present

## 2022-04-18 DIAGNOSIS — Z79899 Other long term (current) drug therapy: Secondary | ICD-10-CM | POA: Diagnosis not present

## 2022-04-18 DIAGNOSIS — E785 Hyperlipidemia, unspecified: Secondary | ICD-10-CM | POA: Insufficient documentation

## 2022-04-18 LAB — CBC WITH DIFFERENTIAL (CANCER CENTER ONLY)
Abs Immature Granulocytes: 0.03 10*3/uL (ref 0.00–0.07)
Basophils Absolute: 0.1 10*3/uL (ref 0.0–0.1)
Basophils Relative: 1 %
Eosinophils Absolute: 0.3 10*3/uL (ref 0.0–0.5)
Eosinophils Relative: 3 %
HCT: 34 % — ABNORMAL LOW (ref 36.0–46.0)
Hemoglobin: 10.3 g/dL — ABNORMAL LOW (ref 12.0–15.0)
Immature Granulocytes: 0 %
Lymphocytes Relative: 27 %
Lymphs Abs: 2.5 10*3/uL (ref 0.7–4.0)
MCH: 22.4 pg — ABNORMAL LOW (ref 26.0–34.0)
MCHC: 30.3 g/dL (ref 30.0–36.0)
MCV: 73.9 fL — ABNORMAL LOW (ref 80.0–100.0)
Monocytes Absolute: 0.5 10*3/uL (ref 0.1–1.0)
Monocytes Relative: 5 %
Neutro Abs: 6 10*3/uL (ref 1.7–7.7)
Neutrophils Relative %: 64 %
Platelet Count: 292 10*3/uL (ref 150–400)
RBC: 4.6 MIL/uL (ref 3.87–5.11)
RDW: 27.8 % — ABNORMAL HIGH (ref 11.5–15.5)
WBC Count: 9.3 10*3/uL (ref 4.0–10.5)
nRBC: 0 % (ref 0.0–0.2)

## 2022-04-18 LAB — RETICULOCYTES
Immature Retic Fract: 14.1 % (ref 2.3–15.9)
RBC.: 4.6 MIL/uL (ref 3.87–5.11)
Retic Count, Absolute: 93.8 10*3/uL (ref 19.0–186.0)
Retic Ct Pct: 2 % (ref 0.4–3.1)

## 2022-04-18 LAB — FERRITIN: Ferritin: 28 ng/mL (ref 11–307)

## 2022-04-18 LAB — IRON AND IRON BINDING CAPACITY (CC-WL,HP ONLY)
Iron: 61 ug/dL (ref 28–170)
Saturation Ratios: 17 % (ref 10.4–31.8)
TIBC: 368 ug/dL (ref 250–450)
UIBC: 307 ug/dL (ref 148–442)

## 2022-04-18 NOTE — Progress Notes (Signed)
Hematology and Oncology Follow Up Visit  Annette Ford [id number] Apr 03, 1980 42 y.o. 04/18/2022  Past Medical History:  Diagnosis Date   Hyperlipidemia 01/03/2021   Hypertension    PCOS (polycystic ovarian syndrome)    PCOS (polycystic ovarian syndrome)     Principle Diagnosis:  IDA secondary to menorrhagia  Current Therapy:   Oral iron   PriorTherapy:   None    Interim History:  Annette Ford is back for follow-up for her IDA:  She reports that her fatigue is about the same as it has been but she is craving ice a bit less. She has been taking her oral iron Ford days but reports that she does not often eat breakfast and sometimes misses doses. She is tolerating this well without GI side effects. Since being on the iron her menstrual cycles have lightened a bit. She is still having 2 cycles per month now. No SOB, chest pain.   Since our last visit she had one injection of B12 by her PCP for a low B12 level of 369. She is not on oral B12 at this time. She has not had a vitamin D level drawn.      Wt Readings from Last 3 Encounters:  04/18/22 207 lb 6.4 oz (94.1 kg)  04/10/22 206 lb 3.2 oz (93.5 kg)  03/21/22 202 lb (91.6 kg)     Medications:   Current Outpatient Medications:    amLODipine (NORVASC) 10 MG tablet, Take 1 tablet (10 mg total) by mouth daily., Disp: 90 tablet, Rfl: 1   hydrochlorothiazide (HYDRODIURIL) 12.5 MG tablet, Take 1 tablet (12.5 mg total) by mouth daily., Disp: 90 tablet, Rfl: 1   ibuprofen (ADVIL) 800 MG tablet, Take 1 tablet (800 mg total) by mouth every 8 (eight) hours as needed for mild pain or moderate pain. Take with food to prevent GI upset, Disp: 30 tablet, Rfl: 0   Iron-FA-B Cmp-C-Biot-Probiotic (FUSION PLUS) CAPS, Take 1 capsule by mouth daily after breakfast., Disp: 30 capsule, Rfl: 6   metoprolol (TOPROL XL) 200 MG 24 hr tablet, Take 1 tablet (200 mg total) by mouth daily., Disp: 90 tablet, Rfl: 1  Allergies: No Known  Allergies  Past Medical History, Surgical history, Social history, and Family History were reviewed and updated.  Review of Systems: Review of Systems  Constitutional:  Negative for appetite change, fever and unexpected weight change.  HENT:   Negative for nosebleeds.   Respiratory:  Negative for hemoptysis and shortness of breath.   Gastrointestinal:  Negative for blood in stool.  Genitourinary:  Negative for hematuria.   Neurological:  Negative for headaches.     Physical Exam:  height is 5\' 5"  (1.651 m) and weight is 207 lb 6.4 oz (94.1 kg). Her oral temperature is 98.3 F (36.8 C). Her blood pressure is 126/84 and her pulse is 73. Her respiration is 18 and oxygen saturation is 100%.   Physical Exam Vitals and nursing note reviewed.  Constitutional:      General: She is not in acute distress.    Appearance: Normal appearance. She is obese. She is not ill-appearing, toxic-appearing or diaphoretic.  Eyes:     Conjunctiva/sclera: Conjunctivae normal.  Cardiovascular:     Rate and Rhythm: Normal rate and regular rhythm.     Heart sounds: Normal heart sounds.  Pulmonary:     Effort: Pulmonary effort is normal.     Breath sounds: Normal breath sounds.  Skin:    Coloration: Skin is not pale.  Findings: No rash.  Neurological:     Mental Status: She is alert.       Lab Results  Component Value Date   WBC 9.3 04/18/2022   HGB 10.3 (L) 04/18/2022   HCT 34.0 (L) 04/18/2022   MCV 73.9 (L) 04/18/2022   PLT 292 04/18/2022     Chemistry      Component Value Date/Time   NA 137 03/21/2022 1353   K 3.9 03/21/2022 1353   CL 104 03/21/2022 1353   CO2 25 03/21/2022 1353   BUN 11 03/21/2022 1353   CREATININE 0.90 03/21/2022 1353      Component Value Date/Time   CALCIUM 9.0 03/21/2022 1353   ALKPHOS 52 03/21/2022 1353   AST 16 03/21/2022 1353   ALT 18 03/21/2022 1353   BILITOT 0.4 03/21/2022 1353      Impression and Plan: 1. Iron deficiency anemia due to chronic  blood loss  2. Irregular menstruation  3. B12 deficiency  4. Other fatigue    Annette Ford   Disposition:  Her hemoglobin has trended up and is now 10.3 however she is still microcytic.  Likely still iron deficient despite oral supplementation.  We had a long discussion regarding IV supplementation including risks and benefits along with common potential side effects.  Our plan will be IV supplementation if her ferritin level is below 40.  She will start oral B12 and I will obtain a vitamin D level today to assess for any other factors involving her fatigue.  We will plan to see her back in 3 months or sooner as needed.  Nelwyn Salisbury PA-C 1/25/202411:02 AM

## 2022-04-19 ENCOUNTER — Other Ambulatory Visit: Payer: Self-pay | Admitting: Medical Oncology

## 2022-04-19 LAB — MISC LABCORP TEST (SEND OUT): Labcorp test code: 81950

## 2022-04-19 MED ORDER — ERGOCALCIFEROL 1.25 MG (50000 UT) PO CAPS: 50000.0000 [IU] | ORAL_CAPSULE | ORAL | 3 refills | Status: AC

## 2022-04-23 ENCOUNTER — Ambulatory Visit (INDEPENDENT_AMBULATORY_CARE_PROVIDER_SITE_OTHER): Payer: Commercial Managed Care - PPO | Admitting: Behavioral Health

## 2022-04-23 DIAGNOSIS — F4323 Adjustment disorder with mixed anxiety and depressed mood: Secondary | ICD-10-CM

## 2022-04-23 NOTE — Progress Notes (Signed)
[name] Counselor Initial Adult Exam  Name: Annette Ford Date: 04/23/2022 MRN: [id number] DOB: 09-29-1980 PCP: Fredirick Lathe, PA-C  Time spent: 60 min Caregility video; Pt is     & Provider working remote from [place]:  60 min [place] video; Pt is in her car @ work for privacy & Provider @ [facility] requested: No   Reason for Visit /Presenting Problem: Pt has work stress, Motherhood fatigue, & life stressors of raising a young Family  Mental Status Exam: Appearance:   Casual     Behavior:  Appropriate and Sharing  Motor:  Normal  Speech/Language:   Clear and Coherent  Affect:  Appropriate  Mood:  normal  Thought process:  normal  Thought content:    WNL  Sensory/Perceptual disturbances:    WNL  Orientation:  oriented to person, place, and time/date  Attention:  Good  Concentration:  Good  Memory:  WNL  Fund of knowledge:   Good  Insight:    Good  Judgment:   Good  Impulse Control:  Good    Risk Assessment: Danger to Self:  No Self-injurious Behavior: No Danger to Others: No Duty to Warn:no Physical Aggression / Violence:No  Access to Firearms a concern: No  Gang Involvement:No  Patient / guardian was educated about steps to take if suicide or homicide risk level increases between visits: yes While future psychiatric events cannot be accurately predicted, the patient does not currently require acute inpatient psychiatric care and does not currently meet [facility] involuntary commitment criteria.  Substance Abuse History: Current substance abuse: No     Past Psychiatric History:   No previous psychological problems have been observed Outpatient Providers: Alyssa Allwardt, PA-C History of Psych Hospitalization: No  Psychological Testing:  NA    Abuse History:  Victim of: No.,  NA    Report needed: No. Victim of Neglect:No. Perpetrator of  NA   Witness / Exposure to  Domestic Violence: No   Protective Services Involvement: No  Witness to [employer] Violence:  No   Family History:  Family History  Problem Relation Age of Onset   Alcohol abuse Mother    COPD Mother        cause of death   Depression Mother    Diabetes Mother    Drug abuse Mother    Mental illness Mother    Hypertension Mother    Schizophrenia Mother    Anxiety disorder Mother    Congestive Heart Failure Mother    Alcohol abuse Father    Drug abuse Father    Stroke Father        cause of death - heat stroke   Mental illness Father    Depression Father    Anxiety disorder Father    Bipolar disorder Father    Hypertension Sister    Diabetes Maternal Grandmother    Hypertension Maternal Grandmother    Diabetes Maternal Grandfather    COPD Maternal Grandfather    Hypertension Maternal Grandfather    Heart disease Paternal Grandmother    Hypertension Paternal Grandmother    Diabetes Paternal Grandfather    Hypertension Paternal Grandfather    Cancer Maternal Aunt    Diabetes Paternal Aunt     Living situation: the patient lives with their family  Sexual Orientation: Straight  Relationship Status: married  Name of spouse / other: Orpah Greek If a parent, number of children / ages:3yo Twins [place] &  [place]: spouse friends  Museum/gallery curator Stress:  No   Income/Employment/Disability: Employment  Armed forces logistics/support/administrative officer: No   Educational History: Education: college graduate  Religion/Sprituality/World View: Christian upbringing  Any cultural differences that may affect / interfere with treatment:  None noted today  Recreation/Hobbies: Unk  Stressors: Health problems   Other: Fatigue, work stress    Strengths: Supportive Relationships, Family, Friends, Spirituality, Hopefulness, Conservator, museum/gallery, and Able to Communicate Effectively  Barriers:  None noted today   Legal History: Pending legal issue / charges: The patient has no significant history  of legal issues. History of legal issue / charges:  NA  Medical History/Surgical History: reviewed Past Medical History:  Diagnosis Date   Hyperlipidemia 01/03/2021   Hypertension    PCOS (polycystic ovarian syndrome)    PCOS (polycystic ovarian syndrome)     Past Surgical History:  Procedure Laterality Date   CESAREAN SECTION     CHOLECYSTECTOMY     TUBAL LIGATION      Medications: Current Outpatient Medications  Medication Sig Dispense Refill   ergocalciferol (VITAMIN D2) 1.25 MG (50000 UT) capsule Take 1 capsule (50,000 Units total) by mouth once a week. 4 capsule 3   amLODipine (NORVASC) 10 MG tablet Take 1 tablet (10 mg total) by mouth daily. 90 tablet 1   hydrochlorothiazide (HYDRODIURIL) 12.5 MG tablet Take 1 tablet (12.5 mg total) by mouth daily. 90 tablet 1   ibuprofen (ADVIL) 800 MG tablet Take 1 tablet (800 mg total) by mouth every 8 (eight) hours as needed for mild pain or moderate pain. Take with food to prevent GI upset 30 tablet 0   Iron-FA-B Cmp-C-Biot-Probiotic (FUSION PLUS) CAPS Take 1 capsule by mouth daily after breakfast. 30 capsule 6   metoprolol (TOPROL XL) 200 MG 24 hr tablet Take 1 tablet (200 mg total) by mouth daily. 90 tablet 1   No current facility-administered medications for this visit.    No Known Allergies  Diagnoses:  Adjustment disorder with mixed anxiety and depressed mood  Plan of Care: [name] c/o her stress, fatigue, & health issues. The Family Bed is a current issue. She will use the Plan we constructed today to break the Family bed habit. She will report the successes @ next session.  Target Date: 2/30/2024  Progress: 4  Frequency: Twice monthly  Modality: Boykin Reaper, LMFT

## 2022-04-23 NOTE — Progress Notes (Signed)
                Murielle Stang L Tiberius Loftus, LMFT 

## 2022-04-26 ENCOUNTER — Other Ambulatory Visit: Payer: Self-pay | Admitting: Physician Assistant

## 2022-04-26 ENCOUNTER — Encounter: Payer: Self-pay | Admitting: Physician Assistant

## 2022-04-26 DIAGNOSIS — I10 Essential (primary) hypertension: Secondary | ICD-10-CM

## 2022-04-26 MED ORDER — BLOOD PRESSURE KIT DEVI: 2 refills | Status: DC

## 2022-05-02 ENCOUNTER — Telehealth: Payer: Self-pay | Admitting: *Deleted

## 2022-05-02 NOTE — Telephone Encounter (Addendum)
Call received from patient stating that she is having "bad constipation" taking iron pills and would like to know what else she can take besides Miralax daily. Pt states that she would like to continue iron pills and not take iron infusions. Marquis Lunch PA notified.  Call placed back to patient and patient notified per order of S. Covington PA to take Miralax BID or Magnesium Glycinate 200 mg or less PO daily.  Pt instructed to call office back if constipation continues after trying the interventions mentioned.  Teach back done. Pt is appreciative of assistance and has no further questions at this time.

## 2022-05-08 ENCOUNTER — Ambulatory Visit (INDEPENDENT_AMBULATORY_CARE_PROVIDER_SITE_OTHER): Payer: Commercial Managed Care - PPO | Admitting: Behavioral Health

## 2022-05-08 DIAGNOSIS — F4323 Adjustment disorder with mixed anxiety and depressed mood: Secondary | ICD-10-CM

## 2022-05-08 NOTE — Progress Notes (Signed)
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 42yo Dtr is fearful of dogs excessively.   Mental Status Exam: Appearance:  Casual     Behavior: Appropriate and Sharing  Motor: Normal  Speech/Language:  Clear and Coherent  Affect: Appropriate  Mood: normal  Thought process: normal  Thought content:   WNL  Sensory/Perceptual disturbances:   WNL  Orientation: oriented to person, place, and time/date  Attention: Good  Concentration: Good  Memory: WNL  Fund of knowledge:  Good  Insight:   Good  Judgment:  Good  Impulse Control: Good   Risk Assessment: Danger to Self:  No Self-injurious Behavior: No Danger to Others: No Duty to Warn:no Physical Aggression / Violence:No  Access to Firearms a concern: No  Gang Involvement:No   Subjective: Pt is concerned for her Dtr's fear of dogs which has translated into her exploration of her own fear of dogs since she was young.   Interventions: Family Systems  Diagnosis:Adjustment disorder with mixed anxiety and depressed mood  Plan: Annette Ford c/o her Dtr's fear of dogs. She will explore & obtain books that instruct her Dtr about beh around dogs. She will use stuffed animals to acclimate her Dtr to the play approach to learning about dogs. She will use these tools to address the fear to inc her comfort levels w/animals & dogs in particular.  Target Date: 06/06/2022  Progress: 2  Frequency: Twice monthly  Modality: Boykin Reaper, LMFT

## 2022-05-08 NOTE — Progress Notes (Signed)
                Mamye Bolds L Avea Mcgowen, LMFT 

## 2022-05-14 ENCOUNTER — Encounter: Payer: Self-pay | Admitting: Physician Assistant

## 2022-05-14 ENCOUNTER — Ambulatory Visit (INDEPENDENT_AMBULATORY_CARE_PROVIDER_SITE_OTHER): Payer: Commercial Managed Care - PPO | Admitting: Physician Assistant

## 2022-05-14 VITALS — BP 132/88 | HR 77 | Temp 98.0°F | Ht 65.0 in | Wt 210.0 lb

## 2022-05-14 DIAGNOSIS — N921 Excessive and frequent menstruation with irregular cycle: Secondary | ICD-10-CM

## 2022-05-14 LAB — POCT URINE PREGNANCY: Preg Test, Ur: NEGATIVE

## 2022-05-14 MED ORDER — KETOROLAC TROMETHAMINE 60 MG/2ML IM SOLN
60.0000 mg | Freq: Once | INTRAMUSCULAR | Status: AC
  Administered 2022-05-14: 60 mg via INTRAMUSCULAR

## 2022-05-14 MED ORDER — KETOROLAC TROMETHAMINE 10 MG PO TABS: 10.0000 mg | ORAL_TABLET | Freq: Four times a day (QID) | ORAL | 0 refills | Status: DC | PRN

## 2022-05-14 MED ORDER — MEDROXYPROGESTERONE ACETATE 10 MG PO TABS: ORAL_TABLET | ORAL | 0 refills | Status: DC

## 2022-05-14 NOTE — Patient Instructions (Addendum)
It was great to see you!  You received a toradol injection today  You can start oral toradol tomorrow You can take tylenol if needed  Please plan to follow-up with ob-gyn for your new patient appointment to follow-up on all of these issues  Start Provera 10 mg 3 times a day for 5 to 10 days -- you can continue this until you see gynecology.   Take care,  Inda Coke PA-C

## 2022-05-14 NOTE — Progress Notes (Signed)
Annette Ford is a 42 y.o. female here for a follow up of a pre-existing problem.  History of Present Illness:   Chief Complaint  Patient presents with   Menorrhagia    Pt c/o heavy periods and irregular cycles x 3 months. Pt started bleeding on Sunday, now today is very heavy changing pad every 30 minutes this morning x 2., passing clots size of quarter, having cramps and cycle is very painful. She took Tylenol and Ibuprofen last night.    HPI   Menorrhagia; Dysmenorrhea Patient has had ongoing painful periods for quite some time. The last 3 cycles have been increasingly heavy and irregular. This cycle, she is two days in, has been the worst yet. Having constant bleeding and passing large clots. Having significant pain that her high dose ibuprofen is not touching, which normally does help her symptoms.  Tubes tied in 2020  Has hx of PCOS - she has not been on any contraception in years. Has hx of iron deficiency anemia -- followed by Dr Marin Olp.  Has upcoming new patient appt with gynecology in 8 days!   Past Medical History:  Diagnosis Date   Hyperlipidemia 01/03/2021   Hypertension    PCOS (polycystic ovarian syndrome)    PCOS (polycystic ovarian syndrome)      Social History   Tobacco Use   Smoking status: Never   Smokeless tobacco: Never  Vaping Use   Vaping Use: Never used  Substance Use Topics   Alcohol use: Yes    Comment: occasionally   Drug use: No    Past Surgical History:  Procedure Laterality Date   CESAREAN SECTION     CHOLECYSTECTOMY     TUBAL LIGATION      Family History  Problem Relation Age of Onset   Alcohol abuse Mother    COPD Mother        cause of death   Depression Mother    Diabetes Mother    Drug abuse Mother    Mental illness Mother    Hypertension Mother    Schizophrenia Mother    Anxiety disorder Mother    Congestive Heart Failure Mother    Alcohol abuse Father    Drug abuse Father    Stroke Father         cause of death - heat stroke   Mental illness Father    Depression Father    Anxiety disorder Father    Bipolar disorder Father    Hypertension Sister    Diabetes Maternal Grandmother    Hypertension Maternal Grandmother    Diabetes Maternal Grandfather    COPD Maternal Grandfather    Hypertension Maternal Grandfather    Heart disease Paternal Grandmother    Hypertension Paternal Grandmother    Diabetes Paternal Grandfather    Hypertension Paternal Grandfather    Cancer Maternal Aunt    Diabetes Paternal Aunt     No Known Allergies  Current Medications:   Current Outpatient Medications:    amLODipine (NORVASC) 10 MG tablet, Take 1 tablet (10 mg total) by mouth daily., Disp: 90 tablet, Rfl: 1   Blood Pressure Monitoring (BLOOD PRESSURE KIT) DEVI, Use to measure blood pressure daily., Disp: 1 each, Rfl: 2   ergocalciferol (VITAMIN D2) 1.25 MG (50000 UT) capsule, Take 1 capsule (50,000 Units total) by mouth once a week., Disp: 4 capsule, Rfl: 3   hydrochlorothiazide (HYDRODIURIL) 12.5 MG tablet, Take 1 tablet (12.5 mg total) by mouth daily., Disp: 90 tablet, Rfl: 1  ibuprofen (ADVIL) 800 MG tablet, Take 1 tablet (800 mg total) by mouth every 8 (eight) hours as needed for mild pain or moderate pain. Take with food to prevent GI upset, Disp: 30 tablet, Rfl: 0   Iron-FA-B Cmp-C-Biot-Probiotic (FUSION PLUS) CAPS, Take 1 capsule by mouth daily after breakfast., Disp: 30 capsule, Rfl: 6   metoprolol (TOPROL XL) 200 MG 24 hr tablet, Take 1 tablet (200 mg total) by mouth daily., Disp: 90 tablet, Rfl: 1   Review of Systems:   ROS Negative unless otherwise specified per HPI.  Vitals:   Vitals:   05/14/22 1135  BP: 132/88  Pulse: 77  Temp: 98 F (36.7 C)  TempSrc: Temporal  SpO2: 97%  Weight: 210 lb (95.3 kg)  Height: 5' 5"$  (1.651 m)     Body mass index is 34.95 kg/m.  Physical Exam:   Physical Exam Vitals and nursing note reviewed.  Constitutional:      General: She is  not in acute distress.    Appearance: She is well-developed. She is not ill-appearing or toxic-appearing.  Cardiovascular:     Rate and Rhythm: Normal rate and regular rhythm.     Pulses: Normal pulses.     Heart sounds: Normal heart sounds, S1 normal and S2 normal.  Pulmonary:     Effort: Pulmonary effort is normal.     Breath sounds: Normal breath sounds.  Skin:    General: Skin is warm and dry.  Neurological:     Mental Status: She is alert.     GCS: GCS eye subscore is 4. GCS verbal subscore is 5. GCS motor subscore is 6.  Psychiatric:        Speech: Speech normal.        Behavior: Behavior normal. Behavior is cooperative.    Results for orders placed or performed in visit on 05/14/22  POCT urine pregnancy  Result Value Ref Range   Preg Test, Ur Negative Negative     Assessment and Plan:   Menorrhagia with irregular cycle No red flags on exam Urine pregnancy negative Toradol injection today and oral toradol rx provided for tomorrow Start provera 10 mg TID x 5-10 days to help stop bleeding Follow-up with gynecology as scheduled for further evaluation If new/worsening sx, will need to go to the ER  Inda Coke, PA-C

## 2022-05-15 ENCOUNTER — Telehealth: Payer: Self-pay

## 2022-05-15 NOTE — Telephone Encounter (Signed)
Called to check in with patient to see how she was feeling after appt with [facility] yesterday. Pt advised better today, and will keep PCP up to date on info after seeing GYN next week as well.

## 2022-05-22 ENCOUNTER — Other Ambulatory Visit (HOSPITAL_COMMUNITY)
Admission: RE | Admit: 2022-05-22 | Discharge: 2022-05-22 | Disposition: A | Discharge status: Home / Self Care | Payer: Commercial Managed Care - PPO | Source: Ambulatory Visit | Attending: Radiology | Admitting: Radiology

## 2022-05-22 ENCOUNTER — Other Ambulatory Visit: Payer: Self-pay | Admitting: Physician Assistant

## 2022-05-22 ENCOUNTER — Ambulatory Visit (INDEPENDENT_AMBULATORY_CARE_PROVIDER_SITE_OTHER): Payer: Commercial Managed Care - PPO | Admitting: Radiology

## 2022-05-22 ENCOUNTER — Encounter: Payer: Self-pay | Admitting: Radiology

## 2022-05-22 VITALS — BP 126/84 | Ht 65.5 in | Wt 210.0 lb

## 2022-05-22 DIAGNOSIS — Z1231 Encounter for screening mammogram for malignant neoplasm of breast: Secondary | ICD-10-CM

## 2022-05-22 DIAGNOSIS — E282 Polycystic ovarian syndrome: Secondary | ICD-10-CM

## 2022-05-22 DIAGNOSIS — Z01419 Encounter for gynecological examination (general) (routine) without abnormal findings: Secondary | ICD-10-CM | POA: Insufficient documentation

## 2022-05-22 DIAGNOSIS — N841 Polyp of cervix uteri: Secondary | ICD-10-CM | POA: Diagnosis not present

## 2022-05-22 DIAGNOSIS — N921 Excessive and frequent menstruation with irregular cycle: Secondary | ICD-10-CM | POA: Diagnosis not present

## 2022-05-22 DIAGNOSIS — N852 Hypertrophy of uterus: Secondary | ICD-10-CM

## 2022-05-22 LAB — PREGNANCY, URINE: Preg Test, Ur: NEGATIVE

## 2022-05-22 NOTE — Progress Notes (Signed)
Meschelle Bryant-McNeill Jul 12, 1980 [id number]   History:  42 y.o. G2P113 Desires AEX and problem visit today.  Referred from PCP for irregular periods.  Hx of PCOS. Had difficulty getting pregnant, was on metformin and clomid. Periods have been very heavy with large clots for the past 2 years, worse for the past 5 months with 5 day episodes of heavy bleeding and clotting. She did not seek care until recently because of her depression. Most recently her PCP treated patient with Provera 05/14/22. Being treated with IV iron infusions by hematology.   Gynecologic History Patient's last menstrual period was 05/12/2022 (exact date). Period Pattern: (!) Irregular Menstrual Flow: Heavy Menstrual Control: Maxi pad Dysmenorrhea: (!) Severe (moderate to severe) Dysmenorrhea Symptoms: Cramping Contraception/Family planning: tubal ligation Sexually active: yes, rarely Last Pap: 2020. Results were: normal, history of abnormals Last mammogram: never  Obstetric History OB History  Gravida Para Term Preterm AB Living  2 2     0 2  SAB IAB Ectopic Multiple Live Births        1      # Outcome Date GA Lbr Len/2nd Weight Sex Delivery Anes PTL Lv  2 Para           1A Para           1B Para              The following portions of the patient's history were reviewed and updated as appropriate: allergies, current medications, past family history, past medical history, past social history, past surgical history, and problem list.  Review of Systems Pertinent items noted in HPI and remainder of comprehensive ROS otherwise negative.   Past medical history, past surgical history, family history and social history were all reviewed and documented in the EPIC chart.   Exam:  Vitals:   05/22/22 0846  BP: 126/84  Weight: 210 lb (95.3 kg)  Height: 5' 5.5" (1.664 m)   Body mass index is 34.41 kg/m.  General appearance:  Normal Thyroid:  Symmetrical, normal in size, without palpable masses or  nodularity. Respiratory  Auscultation:  Clear without wheezing or rhonchi Cardiovascular  Auscultation:  Regular rate, without rubs, murmurs or gallops  Edema/varicosities:  Not grossly evident Abdominal  Soft,nontender, without masses, guarding or rebound.  Liver/spleen:  No organomegaly noted  Hernia:  None appreciated  Skin  Inspection:  Grossly normal Breasts: Examined lying and sitting.   Right: Without masses, retractions, nipple discharge or axillary adenopathy.   Left: Without masses, retractions, nipple discharge or axillary adenopathy. Genitourinary   Inguinal/mons:  Normal without inguinal adenopathy  External genitalia:  Normal appearing vulva with no masses, tenderness, or lesions  BUS/Urethra/Skene's glands:  Normal without masses or exudate  Vagina:  Normal appearing with normal color and discharge, no lesions  Cervix:  Friable, 3 x2 cm polyp extending from the cervical os, pap obtained  Uterus:  Enlarged 12 week size, [place], nontender  Adnexa/parametria:     Rt: Normal in size, without masses or tenderness.   Lt: Normal in size, without masses or tenderness.  Anus and perineum: Normal   Patient informed chaperone available to be present for breast and pelvic exam. Patient has requested no chaperone to be present. Patient has been advised what will be completed during breast and pelvic exam.   Assessment/Plan:   1. Well woman exam with routine gynecological exam Pap with co-testing Schedule mamm[facility] at [facility], overdue for baseline screening  2. Menorrhagia with irregular cycle - Pregnancy, urine;  negative  3. PCOS (polycystic ovarian syndrome)  4. Polyp at cervical os  5. Enlarged uterus - US Sonohysterogram; Future  Will schedule [facility] to look further at base of polyp before removal, discussed risks of removal today including bleeding, patient prefers to wait until u/s  Discussed SBE, colonoscopy and DEXA screening as directed/appropriate. Recommend 127mns  of exercise weekly, including weight bearing exercise. Encouraged the use of seatbelts and sunscreen. Return in 1 year for annual or as needed.   CKerry DoryWHNP-BC 9:33 AM 05/22/2022

## 2022-05-23 ENCOUNTER — Ambulatory Visit (INDEPENDENT_AMBULATORY_CARE_PROVIDER_SITE_OTHER): Payer: Commercial Managed Care - PPO | Admitting: Behavioral Health

## 2022-05-23 DIAGNOSIS — F4323 Adjustment disorder with mixed anxiety and depressed mood: Secondary | ICD-10-CM | POA: Diagnosis not present

## 2022-05-23 NOTE — Progress Notes (Signed)
                Annette Ford L Fahim Kats, LMFT 

## 2022-05-23 NOTE — Progress Notes (Signed)
[facility] Counselor/Therapist Progress Note  Patient ID: Annette Ford, MRN: [id number],    Date: 05/23/2022  Time Spent: 4 min Caregility; Pt is home in private & Provider is working remote from [employer]   Treatment Type: Individual Therapy  Reported Symptoms: Elevated anx/dep & stressors due to Family issues w/children  Mental Status Exam: Appearance:  Casual     Behavior: Appropriate and Sharing  Motor: Normal  Speech/Language:  Clear and Coherent  Affect: Appropriate  Mood: normal  Thought process: WNL  Thought content:   WNL  Sensory/Perceptual disturbances:   WNL  Orientation: oriented to person, place, and time/date  Attention: Good  Concentration: Good  Memory: WNL  Fund of knowledge:  Good  Insight:   Good  Judgment:  Good  Impulse Control: Good   Risk Assessment: Danger to Self:  No Self-injurious Behavior: No Danger to Others: No Duty to Warn:no Physical Aggression / Violence:No  Access to Firearms a concern: No  Gang Involvement:No   Subjective: Pt is upset due to Dtr Annette Ford's difficult adjustment @ [employer]   Interventions: Family Systems  Diagnosis:Adjustment disorder with mixed anxiety and depressed mood  Plan: Pt will contact her choices of Referrals for Psychiatic Care & secure appt. She will also make a f/u appt w her PCP to discuss Tx with an Psychopharmacological that will "take the edge off of her mental concerns".  Target Date: 06/21/2022   Progress: 4  Frequency: Twice monthly  Modality: Boykin Reaper, LMFT

## 2022-05-24 ENCOUNTER — Encounter: Payer: Self-pay | Admitting: Physician Assistant

## 2022-05-24 ENCOUNTER — Telehealth: Payer: Self-pay | Admitting: Physician Assistant

## 2022-05-24 ENCOUNTER — Telehealth (INDEPENDENT_AMBULATORY_CARE_PROVIDER_SITE_OTHER): Payer: Commercial Managed Care - PPO | Admitting: Physician Assistant

## 2022-05-24 VITALS — Ht 65.54 in | Wt 210.0 lb

## 2022-05-24 DIAGNOSIS — F4323 Adjustment disorder with mixed anxiety and depressed mood: Secondary | ICD-10-CM

## 2022-05-24 LAB — CYTOLOGY - PAP
Comment: NEGATIVE
Diagnosis: NEGATIVE
High risk HPV: NEGATIVE

## 2022-05-24 MED ORDER — BUPROPION HCL ER (XL) 150 MG PO TB24: 150.0000 mg | ORAL_TABLET | Freq: Every day | ORAL | 0 refills | Status: AC

## 2022-05-24 NOTE — Progress Notes (Unsigned)
   Virtual Visit via Video Note  I connected with  Annette Ford  on 05/24/22 at 10:30 AM EST by a video enabled telemedicine application and verified that I am speaking with the correct person using two identifiers.  Location: Patient: home Provider: Therapist, music at [place] present: Patient and myself   I discussed the limitations of evaluation and management by telemedicine and the availability of in person appointments. The patient expressed understanding and agreed to proceed.   History of Present Illness:  Stressful week, having issues with daughter at school.  GYN appt this week 05/22/22 - polyp at cervical os.  Trouble focusing, very overwhelmed feeling.   Feels like every week there is something new she has to deal with.   In order to care for kids (most important people to her), she knows she has to take care of herself.   Yesterday was third meeting with Dr. Theodis Shove for counseling.    Observations/Objective:   Gen: Awake, alert, no acute distress Resp: Breathing is even and non-labored Psych: calm/pleasant demeanor Neuro: Alert and Oriented x 3, + facial symmetry, speech is clear.   Assessment and Plan:   Follow Up Instructions:    I discussed the assessment and treatment plan with the patient. The patient was provided an opportunity to ask questions and all were answered. The patient agreed with the plan and demonstrated an understanding of the instructions.   The patient was advised to call back or seek an in-person evaluation if the symptoms worsen or if the condition fails to improve as anticipated.  Edin Skarda M Minaal Struckman, PA-C

## 2022-05-24 NOTE — Telephone Encounter (Signed)
Patient states: - She would like to know if PCP and [place] spoke about her starting a medication for mental health   Please Advise.

## 2022-05-24 NOTE — Telephone Encounter (Signed)
Please see pt msg and advise 

## 2022-05-27 NOTE — Telephone Encounter (Signed)
Pt saw PCP in virtual visit on Friday 05/24/2022

## 2022-06-12 ENCOUNTER — Ambulatory Visit: Payer: Commercial Managed Care - PPO | Admitting: Behavioral Health

## 2022-06-27 ENCOUNTER — Ambulatory Visit (INDEPENDENT_AMBULATORY_CARE_PROVIDER_SITE_OTHER): Payer: Commercial Managed Care - PPO

## 2022-06-27 ENCOUNTER — Ambulatory Visit (INDEPENDENT_AMBULATORY_CARE_PROVIDER_SITE_OTHER): Payer: Commercial Managed Care - PPO | Admitting: Obstetrics and Gynecology

## 2022-06-27 ENCOUNTER — Other Ambulatory Visit: Payer: Self-pay | Admitting: Radiology

## 2022-06-27 ENCOUNTER — Encounter: Payer: Self-pay | Admitting: Obstetrics and Gynecology

## 2022-06-27 ENCOUNTER — Other Ambulatory Visit (HOSPITAL_COMMUNITY)
Admission: RE | Admit: 2022-06-27 | Discharge: 2022-06-27 | Disposition: A | Discharge status: Home / Self Care | Payer: Commercial Managed Care - PPO | Source: Ambulatory Visit | Attending: Obstetrics and Gynecology | Admitting: Obstetrics and Gynecology

## 2022-06-27 VITALS — BP 110/86 | Wt 209.0 lb

## 2022-06-27 DIAGNOSIS — N841 Polyp of cervix uteri: Secondary | ICD-10-CM

## 2022-06-27 DIAGNOSIS — N939 Abnormal uterine and vaginal bleeding, unspecified: Secondary | ICD-10-CM

## 2022-06-27 DIAGNOSIS — Z01812 Encounter for preprocedural laboratory examination: Secondary | ICD-10-CM

## 2022-06-27 LAB — PREGNANCY, URINE: Preg Test, Ur: NEGATIVE

## 2022-06-27 MED ORDER — IBUPROFEN 200 MG PO TABS
600.0000 mg | ORAL_TABLET | Freq: Four times a day (QID) | ORAL | Status: AC | PRN
  Administered 2022-06-27: 600 mg via ORAL

## 2022-06-27 MED ORDER — NORETHINDRONE 0.35 MG PO TABS: 1.0000 | ORAL_TABLET | Freq: Every day | ORAL | 3 refills | Status: AC

## 2022-06-27 NOTE — Patient Instructions (Signed)

## 2022-06-27 NOTE — Progress Notes (Signed)
GYNECOLOGY  VISIT   HPI: 42 y.o.   Married Black or [place] American Not Hispanic or Latino  female   [phone] with Patient's last menstrual period was 06/03/2022 (approximate).   here for further evaluation of menorrhagia leading to anemia, an enlarged uterus on exam and a large polyp coming out of her cervix.   She was having monthly, heavy cycles. In the last several months she has been having 2 cycles a month.  Prior to 10/23 cycles were monthly x 5-7 days. Saturating a pad in 3-4 hours. From 10/23 through 12/23 she was having cycles every 2.5-3 weeks x  5-7 days. Same level of heaviness. In 1/24 she had a normal cycle. In 2/24 she had a cycle 4-5 weeks after her last cycle. That cycle was very heavy, saturating a pad in less than an hour. She was passing large clots and having cramps. In 3/24 cycle was heavy for 1-2 days. Cramping has been worse in the last few months.  In 12/23 she had a hgb of 8.5. Her most recent hgb in 1/24 was 10.3.  Normal TSH on 03/13/22.   Pap from 05/22/22 returned as negative with negative HPV.  GYNECOLOGIC HISTORY: Patient's last menstrual period was 06/03/2022 (approximate). Contraception:tubal ligation Menopausal hormone therapy: no        OB History     Gravida  3   Para  3   Term  1   Preterm  1   AB  0   Living  3      SAB      IAB      Ectopic      Multiple  1   Live Births                 Patient Active Problem List   Diagnosis Date Noted   Adjustment disorder with mixed anxiety and depressed mood 05/24/2022   PCOS (polycystic ovarian syndrome) 04/10/2022   Iron deficiency anemia due to chronic blood loss 04/10/2022   B12 deficiency 04/10/2022   Stress at home 04/10/2022   Other fatigue 04/10/2022   Irregular menstruation 03/18/2022   Essential hypertension 02/18/2022   History of gestational diabetes 02/18/2022   History of anemia 02/18/2022   Hyperlipidemia 01/03/2021   HSV infection 04/08/2018   History of  severe pre-eclampsia 03/05/2018   Obesity (BMI 30.0-34.9) 03/05/2018    Past Medical History:  Diagnosis Date   Anemia    i   Hyperlipidemia 01/03/2021   Hypertension    Iron deficiency    PCOS (polycystic ovarian syndrome)     Past Surgical History:  Procedure Laterality Date   CESAREAN SECTION     CHOLECYSTECTOMY     TUBAL LIGATION      Current Outpatient Medications  Medication Sig Dispense Refill   amLODipine (NORVASC) 10 MG tablet Take 1 tablet (10 mg total) by mouth daily. 90 tablet 1   buPROPion (WELLBUTRIN XL) 150 MG 24 hr tablet Take 1 tablet (150 mg total) by mouth daily. 30 tablet 0   ergocalciferol (VITAMIN D2) 1.25 MG (50000 UT) capsule Take 1 capsule (50,000 Units total) by mouth once a week. 4 capsule 3   hydrochlorothiazide (HYDRODIURIL) 12.5 MG tablet Take 1 tablet (12.5 mg total) by mouth daily. 90 tablet 1   ibuprofen (ADVIL) 800 MG tablet Take 1 tablet (800 mg total) by mouth every 8 (eight) hours as needed for mild pain or moderate pain. Take with food to prevent GI upset 30 tablet  0   Iron-FA-B Cmp-C-Biot-Probiotic (FUSION PLUS) CAPS Take 1 capsule by mouth daily after breakfast. 30 capsule 6   metoprolol (TOPROL XL) 200 MG 24 hr tablet Take 1 tablet (200 mg total) by mouth daily. 90 tablet 1   Current Facility-Administered Medications  Medication Dose Route Frequency Provider Last Rate Last Admin   ibuprofen (ADVIL) tablet 600 mg  600 mg Oral Q6H PRN Salvadore Dom, MD   600 mg at 06/27/22 1231     ALLERGIES: Patient has no known allergies.  Family History  Problem Relation Age of Onset   Alcohol abuse Mother    COPD Mother        cause of death   Depression Mother    Diabetes Mother    Drug abuse Mother    Mental illness Mother    Hypertension Mother    Schizophrenia Mother    Anxiety disorder Mother    Congestive Heart Failure Mother    Alcohol abuse Father    Drug abuse Father    Stroke Father        cause of death - heat stroke    Mental illness Father    Depression Father    Anxiety disorder Father    Bipolar disorder Father    Hypertension Sister    Diabetes Maternal Grandmother    Hypertension Maternal Grandmother    Diabetes Maternal Grandfather    COPD Maternal Grandfather    Hypertension Maternal Grandfather    Heart disease Paternal Grandmother    Hypertension Paternal Grandmother    Diabetes Paternal Grandfather    Hypertension Paternal Grandfather    Cancer Maternal Aunt    Diabetes Paternal Aunt     Social History   Socioeconomic History   Marital status: Married    Spouse name: Not on file   Number of children: 3   Years of education: Not on file   Highest education level: Not on file  Occupational History   Not on file  Tobacco Use   Smoking status: Never    Passive exposure: Past   Smokeless tobacco: Never  Vaping Use   Vaping Use: Never used  Substance and Sexual Activity   Alcohol use: Yes    Comment: occasionally   Drug use: No   Sexual activity: Yes    Partners: Male    Birth control/protection: Surgical    Comment: tubal, menarche 42 yo, sexual debut 42yo  Other Topics Concern   Not on file  Social History Narrative   Not on file   Social Determinants of Health   Financial Resource Strain: Not on file  Food Insecurity: Not on file  Transportation Needs: Not on file  Physical Activity: Not on file  Stress: Not on file  Social Connections: Not on file  Intimate Partner Violence: Not on file    ROS  Pelvic ultrasound  Indications: AUB  Findings:  Uterus 10.94 x 6.40 x 6.33 cm, anteverted Several small intramural fibroids were noted (ranged in size from 0.58 x 0.86 cm to 1 x 0.67 cm)  Endometrium 9.09 cm, secretory, no obvious masses  Cervical polyp seen extending from the external os, feeder vessels extend to the LUS/upper cervix.   Left ovary 3.82 x 2.18 x 2.21 cm  Right ovary 3.05 x 1.74 x 1.51 cm  No free fluid  Sonohysterogram & Endometrial  biopsy The procedure and risks of the procedure were reviewed with the patient, consent form was signed. A speculum was placed in the vagina  and the cervix was cleansed with betadine. A tenaculum was placed on the cervix. The sonohysterogram catheter was inserted into the uterine cavity without difficulty. Saline was infused under direct observation with the ultrasound. No intracavitary defects were noted.The fluid in the catheter was removed.  The endometrial biopsy was then performed with the uterine evacuator. Care was taken to get a representative sample, sampling 360 degrees of the uterine cavity. Moderate tissue was obtained. The tenaculum and speculum were removed. There were no complications.  There was slight oozing from the right tenaculum site that was treated with pressure and silver nitrate.    Impression:  Normal sized anteverted uterus Several small intramural myomas Endometrium without obvious masses Cervical polyp seen, extending out of the external cervical os. Feeder vessel extends to the upper cervix vs lower uterine segment.  Normal ovaries bilaterally Sonohysterogram without obvious intracavitary masses    PHYSICAL EXAMINATION:    BP 110/86   Wt 209 lb (94.8 kg)   LMP 06/03/2022 (Approximate)   BMI 34.21 kg/m     General appearance: alert, cooperative and appears stated age  Pelvic: External genitalia:  no lesions              Urethra:  normal appearing urethra with no masses, tenderness or lesions              Bartholins and Skenes: normal                 Vagina: normal appearing vagina with normal color and discharge, no lesions              Cervix:  very large endocervical polyp  (over 5 x 3 cm) making it difficult to see her external os well. Polyp  removed with ringed forceps.   Chaperone, Gae Dry, CMA was present for exam.  1. Abnormal uterine bleeding No findings on sonohysterogram to explain her heavy bleeding. We discussed possible anovulatory  bleeding.  - Surgical pathology( [place]/ [facility]) -Tubal ligation for contraception -Not a candidate for OCP's -Discussed option of trial of micronor, cyclic progesterone or the mirena IUD. Information was given on the IUD, script for micronor sent.  - norethindrone (ORTHO MICRONOR) 0.35 MG tablet; Take 1 tablet (0.35 mg total) by mouth daily.  Dispense: 84 tablet; Refill: 3 -Menstrual calendar given. She will calendar her cycles for 3 months and then f/u with Rubbie Battiest, NP  2. Cervical polyp Removed - Surgical pathology( [place]/ [facility])  3. Pre-procedure lab exam - Pregnancy, urine  CC: Rubbie Battiest, NP

## 2022-07-01 LAB — SURGICAL PATHOLOGY

## 2022-07-04 ENCOUNTER — Ambulatory Visit
Admission: RE | Admit: 2022-07-04 | Discharge: 2022-07-04 | Disposition: A | Discharge status: Home / Self Care | Payer: Commercial Managed Care - PPO | Source: Ambulatory Visit | Attending: Physician Assistant | Admitting: Physician Assistant

## 2022-07-04 DIAGNOSIS — Z1231 Encounter for screening mammogram for malignant neoplasm of breast: Secondary | ICD-10-CM

## 2022-07-12 ENCOUNTER — Telehealth: Payer: Self-pay | Admitting: Physician Assistant

## 2022-07-12 NOTE — Telephone Encounter (Signed)
Prescription Request  07/12/2022  LOV: 04/10/2022  What is the name of the medication or equipment? Valtrex (States has taken in past, can't remember dosage but states takes 4 pills before cold sore outbreak)  Have you contacted your pharmacy to request a refill? No   Which pharmacy would you like this sent to?  [id number] - [place] ([place]), [place] - [address] [place] [address] Phone: [phone] Fax: [phone]    Patient notified that their request is being sent to the clinical staff for review and that they should receive a response within 2 business days.   Please advise at Mobile [phone] (mobile)

## 2022-07-15 ENCOUNTER — Other Ambulatory Visit: Payer: Self-pay | Admitting: Physician Assistant

## 2022-07-15 MED ORDER — VALACYCLOVIR HCL 1 G PO TABS: ORAL_TABLET | ORAL | 1 refills | Status: AC

## 2022-07-15 NOTE — Telephone Encounter (Signed)
Please advise pt asking for Rx Valtrex. Not showing in medication list or history.

## 2022-07-18 ENCOUNTER — Inpatient Hospital Stay: Payer: Commercial Managed Care - PPO | Admitting: Medical Oncology

## 2022-07-18 ENCOUNTER — Inpatient Hospital Stay: Payer: Commercial Managed Care - PPO

## 2022-07-22 ENCOUNTER — Encounter: Payer: Self-pay | Admitting: Medical Oncology

## 2022-07-22 ENCOUNTER — Inpatient Hospital Stay: Payer: Commercial Managed Care - PPO | Attending: Medical Oncology

## 2022-07-22 ENCOUNTER — Other Ambulatory Visit: Payer: Self-pay

## 2022-07-22 ENCOUNTER — Inpatient Hospital Stay (HOSPITAL_BASED_OUTPATIENT_CLINIC_OR_DEPARTMENT_OTHER): Payer: Commercial Managed Care - PPO | Admitting: Medical Oncology

## 2022-07-22 VITALS — BP 166/101 | HR 79 | Temp 97.9°F | Resp 18 | Ht 65.0 in | Wt 210.0 lb

## 2022-07-22 DIAGNOSIS — E538 Deficiency of other specified B group vitamins: Secondary | ICD-10-CM | POA: Diagnosis not present

## 2022-07-22 DIAGNOSIS — N920 Excessive and frequent menstruation with regular cycle: Secondary | ICD-10-CM | POA: Diagnosis present

## 2022-07-22 DIAGNOSIS — D500 Iron deficiency anemia secondary to blood loss (chronic): Secondary | ICD-10-CM | POA: Insufficient documentation

## 2022-07-22 LAB — IRON AND IRON BINDING CAPACITY (CC-WL,HP ONLY)
Iron: 36 ug/dL (ref 28–170)
Saturation Ratios: 9 % — ABNORMAL LOW (ref 10.4–31.8)
TIBC: 400 ug/dL (ref 250–450)
UIBC: 364 ug/dL (ref 148–442)

## 2022-07-22 LAB — RETIC PANEL
Immature Retic Fract: 24.4 % — ABNORMAL HIGH (ref 2.3–15.9)
RBC.: 4.54 MIL/uL (ref 3.87–5.11)
Retic Count, Absolute: 84.9 10*3/uL (ref 19.0–186.0)
Retic Ct Pct: 1.9 % (ref 0.4–3.1)
Reticulocyte Hemoglobin: 27.4 pg — ABNORMAL LOW (ref >27.9)

## 2022-07-22 LAB — CBC WITH DIFFERENTIAL (CANCER CENTER ONLY)
Abs Immature Granulocytes: 0.03 10*3/uL (ref 0.00–0.07)
Basophils Absolute: 0.1 10*3/uL (ref 0.0–0.1)
Basophils Relative: 1 %
Eosinophils Absolute: 0.2 10*3/uL (ref 0.0–0.5)
Eosinophils Relative: 2 %
HCT: 36.2 % (ref 36.0–46.0)
Hemoglobin: 12.2 g/dL (ref 12.0–15.0)
Immature Granulocytes: 0 %
Lymphocytes Relative: 26 %
Lymphs Abs: 2.4 10*3/uL (ref 0.7–4.0)
MCH: 26.9 pg (ref 26.0–34.0)
MCHC: 33.7 g/dL (ref 30.0–36.0)
MCV: 79.7 fL — ABNORMAL LOW (ref 80.0–100.0)
Monocytes Absolute: 0.6 10*3/uL (ref 0.1–1.0)
Monocytes Relative: 7 %
Neutro Abs: 6.1 10*3/uL (ref 1.7–7.7)
Neutrophils Relative %: 64 %
Platelet Count: 264 10*3/uL (ref 150–400)
RBC: 4.54 MIL/uL (ref 3.87–5.11)
RDW: 14.6 % (ref 11.5–15.5)
WBC Count: 9.4 10*3/uL (ref 4.0–10.5)
nRBC: 0 % (ref 0.0–0.2)

## 2022-07-22 LAB — CMP (CANCER CENTER ONLY)
ALT: 15 U/L (ref 0–44)
AST: 14 U/L — ABNORMAL LOW (ref 15–41)
Albumin: 4.2 g/dL (ref 3.5–5.0)
Alkaline Phosphatase: 44 U/L (ref 38–126)
Anion gap: 5 (ref 5–15)
BUN: 9 mg/dL (ref 6–20)
CO2: 25 mmol/L (ref 22–32)
Calcium: 8.7 mg/dL — ABNORMAL LOW (ref 8.9–10.3)
Chloride: 107 mmol/L (ref 98–111)
Creatinine: 0.92 mg/dL (ref 0.44–1.00)
GFR, Estimated: >60 mL/min (ref >60)
Glucose, Bld: 103 mg/dL — ABNORMAL HIGH (ref 70–99)
Potassium: 3.8 mmol/L (ref 3.5–5.1)
Sodium: 137 mmol/L (ref 135–145)
Total Bilirubin: 0.3 mg/dL (ref 0.3–1.2)
Total Protein: 6.7 g/dL (ref 6.5–8.1)

## 2022-07-22 LAB — VITAMIN B12: Vitamin B-12: 298 pg/mL (ref 180–914)

## 2022-07-22 LAB — FERRITIN: Ferritin: 7 ng/mL — ABNORMAL LOW (ref 11–307)

## 2022-07-22 NOTE — Progress Notes (Signed)
Hematology and Oncology Follow Up Visit  Annette Ford [id number] 1980-06-22 42 y.o. 07/22/2022  Past Medical History:  Diagnosis Date   Anemia    i   Hyperlipidemia 01/03/2021   Hypertension    Iron deficiency    PCOS (polycystic ovarian syndrome)     Principle Diagnosis:  IDA secondary to menorrhagia  Current Therapy:   Oral iron   PriorTherapy:   None    Interim History:  Annette Ford is back for follow-up for her IDA:  She reports that her fatigue is a bit better than it has been. Still has a lot of life stress. She tried the iron supplement daily but had too much constipation. Tried daily iron with miralax and stool softener but this caused diarrhea. Now back to every other day iron and doing well. Menstrual cycles stable.  No SOB, chest pain.   Still getting B12 at her PCP.   She reports that she is moving to the [place] area soon.      Wt Readings from Last 3 Encounters:  07/22/22 210 lb 0.6 oz (95.3 kg)  06/27/22 209 lb (94.8 kg)  05/24/22 210 lb (95.3 kg)     Medications:   Current Outpatient Medications:    amLODipine (NORVASC) 10 MG tablet, Take 1 tablet (10 mg total) by mouth daily., Disp: 90 tablet, Rfl: 1   buPROPion (WELLBUTRIN XL) 150 MG 24 hr tablet, Take 1 tablet (150 mg total) by mouth daily., Disp: 30 tablet, Rfl: 0   ergocalciferol (VITAMIN D2) 1.25 MG (50000 UT) capsule, Take 1 capsule (50,000 Units total) by mouth once a week., Disp: 4 capsule, Rfl: 3   hydrochlorothiazide (HYDRODIURIL) 12.5 MG tablet, Take 1 tablet (12.5 mg total) by mouth daily., Disp: 90 tablet, Rfl: 1   ibuprofen (ADVIL) 800 MG tablet, Take 1 tablet (800 mg total) by mouth every 8 (eight) hours as needed for mild pain or moderate pain. Take with food to prevent GI upset, Disp: 30 tablet, Rfl: 0   Iron-FA-B Cmp-C-Biot-Probiotic (FUSION PLUS) CAPS, Take 1 capsule by mouth daily after breakfast., Disp: 30 capsule, Rfl: 6   metoprolol (TOPROL XL) 200 MG 24 hr  tablet, Take 1 tablet (200 mg total) by mouth daily., Disp: 90 tablet, Rfl: 1   norethindrone (ORTHO MICRONOR) 0.35 MG tablet, Take 1 tablet (0.35 mg total) by mouth daily., Disp: 84 tablet, Rfl: 3   valACYclovir (VALTREX) 1000 MG tablet, Take 2 pills twice a day for 1 day at first sign of cold sore, Disp: 20 tablet, Rfl: 1  Current Facility-Administered Medications:    ibuprofen (ADVIL) tablet 600 mg, 600 mg, Oral, Q6H PRN, Romualdo Bolk, MD, 600 mg at 06/27/22 1231  Allergies: No Known Allergies  Past Medical History, Surgical history, Social history, and Family History were reviewed and updated.  Review of Systems: Review of Systems  Constitutional:  Negative for appetite change, fever and unexpected weight change.  HENT:   Negative for nosebleeds.   Respiratory:  Negative for hemoptysis and shortness of breath.   Gastrointestinal:  Negative for blood in stool.  Genitourinary:  Negative for hematuria.   Neurological:  Negative for headaches.     Physical Exam:  height is 5\' 5"  (1.651 m) and weight is 210 lb 0.6 oz (95.3 kg). Her oral temperature is 97.9 F (36.6 C). Her blood pressure is 166/101 (abnormal) and her pulse is 79. Her respiration is 18 and oxygen saturation is 100%.   Physical Exam Vitals and nursing note reviewed.  Constitutional:      General: She is not in acute distress.    Appearance: Normal appearance. She is obese. She is not ill-appearing, toxic-appearing or diaphoretic.  Eyes:     Conjunctiva/sclera: Conjunctivae normal.  Cardiovascular:     Rate and Rhythm: Normal rate and regular rhythm.     Heart sounds: Normal heart sounds.  Pulmonary:     Effort: Pulmonary effort is normal.     Breath sounds: Normal breath sounds.  Skin:    Coloration: Skin is not pale.     Findings: No rash.  Neurological:     Mental Status: She is alert.       Lab Results  Component Value Date   WBC 9.4 07/22/2022   HGB 12.2 07/22/2022   HCT 36.2 07/22/2022    MCV 79.7 (L) 07/22/2022   PLT 264 07/22/2022     Chemistry      Component Value Date/Time   NA 137 03/21/2022 1353   K 3.9 03/21/2022 1353   CL 104 03/21/2022 1353   CO2 25 03/21/2022 1353   BUN 11 03/21/2022 1353   CREATININE 0.90 03/21/2022 1353      Component Value Date/Time   CALCIUM 9.0 03/21/2022 1353   ALKPHOS 52 03/21/2022 1353   AST 16 03/21/2022 1353   ALT 18 03/21/2022 1353   BILITOT 0.4 03/21/2022 1353      Impression and Plan: 1. Iron deficiency anemia due to chronic blood loss - CBC with Differential (Cancer Center Only); Future - CMP (Cancer Center only); Future - Ferritin; Future - Vitamin B12; Future - Retic Panel; Future  Her IDA is improving. Hgb has normalized to 12.2  from 10.3 at our last visit and from 8 when she was first referred. MCV has improved from 66.6 at time of referral to 79.7. Immature retic count still a bit elevated at 24.4. Platelets and WBC normal. CMP stable. Iron storage levels pending. She prefers to continue oral supplementation or iron BID at this time.  She will continue to be followed by GI and GYN.   2. B12 deficiency - Vitamin B12; Future Chronic in nature. Still getting B12 shots at PCP's office. She will continue this.    We looked at where she was moving and found that [facility] was likely the closest location for her to transfer her care once she moves.    Disposition: RTC 6 months APP, labs (CBC w/, CMP, B12, iron, ferritin, retic) - she will call and cancel if she is still in this area.    Clent Jacks PA-C 4/29/202410:14 AM

## 2022-07-26 ENCOUNTER — Other Ambulatory Visit: Payer: Self-pay | Admitting: Medical Oncology

## 2022-07-26 DIAGNOSIS — E611 Iron deficiency: Secondary | ICD-10-CM | POA: Insufficient documentation

## 2022-07-29 ENCOUNTER — Inpatient Hospital Stay: Payer: Commercial Managed Care - PPO | Attending: Hematology & Oncology

## 2022-07-29 VITALS — BP 146/78 | HR 79 | Temp 97.7°F | Resp 20

## 2022-07-29 DIAGNOSIS — N920 Excessive and frequent menstruation with regular cycle: Secondary | ICD-10-CM | POA: Diagnosis present

## 2022-07-29 DIAGNOSIS — E611 Iron deficiency: Secondary | ICD-10-CM

## 2022-07-29 DIAGNOSIS — D500 Iron deficiency anemia secondary to blood loss (chronic): Secondary | ICD-10-CM | POA: Diagnosis present

## 2022-07-29 MED ORDER — ACETAMINOPHEN 325 MG PO TABS
650.0000 mg | ORAL_TABLET | Freq: Once | ORAL | Status: AC
  Administered 2022-07-29: 650 mg via ORAL
  Filled 2022-07-29: qty 2

## 2022-07-29 MED ORDER — SODIUM CHLORIDE 0.9 % IV SOLN: Freq: Once | INTRAVENOUS | Status: AC

## 2022-07-29 MED ORDER — SODIUM CHLORIDE 0.9 % IV SOLN
300.0000 mg | Freq: Once | INTRAVENOUS | Status: AC
  Administered 2022-07-29: 300 mg via INTRAVENOUS
  Filled 2022-07-29: qty 300

## 2022-07-29 NOTE — Progress Notes (Signed)
OK to give Venofer today with BP-168/90 per order of Clent Jacks PA.

## 2022-07-29 NOTE — Patient Instructions (Signed)

## 2022-07-31 ENCOUNTER — Telehealth: Payer: Self-pay | Admitting: Physician Assistant

## 2022-07-31 ENCOUNTER — Ambulatory Visit (INDEPENDENT_AMBULATORY_CARE_PROVIDER_SITE_OTHER): Payer: Commercial Managed Care - PPO | Admitting: Physician Assistant

## 2022-07-31 ENCOUNTER — Encounter: Payer: Self-pay | Admitting: Physician Assistant

## 2022-07-31 VITALS — BP 120/82 | HR 77 | Temp 97.7°F | Ht 65.0 in | Wt 211.0 lb

## 2022-07-31 DIAGNOSIS — F4323 Adjustment disorder with mixed anxiety and depressed mood: Secondary | ICD-10-CM | POA: Diagnosis not present

## 2022-07-31 DIAGNOSIS — R519 Headache, unspecified: Secondary | ICD-10-CM | POA: Diagnosis not present

## 2022-07-31 DIAGNOSIS — I10 Essential (primary) hypertension: Secondary | ICD-10-CM

## 2022-07-31 LAB — COMPREHENSIVE METABOLIC PANEL
ALT: 27 U/L (ref 0–35)
AST: 24 U/L (ref 0–37)
Albumin: 4.3 g/dL (ref 3.5–5.2)
Alkaline Phosphatase: 53 U/L (ref 39–117)
BUN: 9 mg/dL (ref 6–23)
CO2: 26 mEq/L (ref 19–32)
Calcium: 9 mg/dL (ref 8.4–10.5)
Chloride: 102 mEq/L (ref 96–112)
Creatinine, Ser: 0.8 mg/dL (ref 0.40–1.20)
GFR: 90.1 mL/min (ref >60.00)
Glucose, Bld: 86 mg/dL (ref 70–99)
Potassium: 4.3 mEq/L (ref 3.5–5.1)
Sodium: 136 mEq/L (ref 135–145)
Total Bilirubin: 0.5 mg/dL (ref 0.2–1.2)
Total Protein: 7.3 g/dL (ref 6.0–8.3)

## 2022-07-31 LAB — SEDIMENTATION RATE: Sed Rate: 18 mm/hr (ref 0–20)

## 2022-07-31 LAB — C-REACTIVE PROTEIN: CRP: <1 mg/dL (ref 0.5–20.0)

## 2022-07-31 NOTE — Patient Instructions (Signed)
It was great to see you!  Blood pressure is great!  Please work on drinking 64 oz water daily Take 400-600 ibuprofen two times daily if needed for headaches  If ANY worsening, go to the ER  If no improvement with ibuprofen, rest and water - call and we can order MRI or CT  Headache Precautions: Contact a doctor if: Your symptoms are not helped by medicine. You have a headache that feels different than the other headaches. You feel sick to your stomach (nauseous) or you throw up (vomit). You have a fever. Get help right away if: Your headache gets very bad quickly. Your headache gets worse after a lot of physical activity. You keep throwing up. You have a stiff neck. You have trouble seeing. You have trouble speaking. You have pain in the eye or ear. Your muscles are weak or you lose muscle control. You lose your balance or have trouble walking. You feel like you will pass out (faint) or you pass out. You are mixed up (confused). You have a seizure.   Take care,  Jarold Motto PA-C

## 2022-07-31 NOTE — Telephone Encounter (Signed)
FYI: This call has been transferred to Access Nurse. Once the result note has been entered staff can address the message at that time.  Patient called in with the following symptoms:  Red Word:elevated blood pressure and headache   Please advise at Mobile [phone] (mobile)  Message is routed to Provider Pool and [facility] Triage

## 2022-07-31 NOTE — Telephone Encounter (Signed)
Appt today with Bufford Buttner

## 2022-07-31 NOTE — Progress Notes (Signed)
Annette Ford is a 42 y.o. female here for a new problem.  History of Present Illness:   Chief Complaint  Patient presents with   Hypertension   Headache    Pt c/o headache right side of temporal since Monday off and on and now persistent since Tuesday. Bp Monday was elevated.    HPI  Left Temporal Headache Started having an intermittent headache after her iron infusion on 07/24/22.  Pain oscillates in intensity. Taking Tylenol with inadequate relief. Has had frequent severe headaches in the past around the time she was diagnosed with hypertension. Headache-related pain was preventing her falling asleep last night. Taking amlodipine 10 mg daily, metoprolol 200 mg daily, hydrochlorothiazide 12.5 mg daily. Her BP cuff is not working currently at home - gives her an "error" reading Received two tooth fillings on the left side of her mouth recently.  Denies visual disturbance, chest pain. Denies changes in medications, eating, sleep, caffeine intake. Denies severe pain in temples. Does not wear glasses, contacts.  Not UTD with eye doctor. Water intake inadequate. Has daily lower leg swelling.  Depression Treated with bupropion 150 mg daily. Notes occasional racing heart rate since starting this on 05/24/22. Has been having recent increase in anxious and depressive thoughts associated with upcoming anniversary of a family death. Denies SI/HI.  Past Medical History:  Diagnosis Date   Anemia    i   Hyperlipidemia 01/03/2021   Hypertension    Iron deficiency    PCOS (polycystic ovarian syndrome)      Social History   Tobacco Use   Smoking status: Never    Passive exposure: Past   Smokeless tobacco: Never  Vaping Use   Vaping Use: Never used  Substance Use Topics   Alcohol use: Yes    Comment: occasionally   Drug use: No    Past Surgical History:  Procedure Laterality Date   CESAREAN SECTION     CHOLECYSTECTOMY     TUBAL LIGATION      Family History   Problem Relation Age of Onset   Alcohol abuse Mother    COPD Mother        cause of death   Depression Mother    Diabetes Mother    Drug abuse Mother    Mental illness Mother    Hypertension Mother    Schizophrenia Mother    Anxiety disorder Mother    Congestive Heart Failure Mother    Alcohol abuse Father    Drug abuse Father    Stroke Father        cause of death - heat stroke   Mental illness Father    Depression Father    Anxiety disorder Father    Bipolar disorder Father    Hypertension Sister    Breast cancer Maternal Aunt    Cancer Maternal Aunt    Diabetes Paternal Aunt    Diabetes Maternal Grandmother    Hypertension Maternal Grandmother    Diabetes Maternal Grandfather    COPD Maternal Grandfather    Hypertension Maternal Grandfather    Heart disease Paternal Grandmother    Hypertension Paternal Grandmother    Diabetes Paternal Grandfather    Hypertension Paternal Grandfather     No Known Allergies  Current Medications:   Current Outpatient Medications:    amLODipine (NORVASC) 10 MG tablet, Take 1 tablet (10 mg total) by mouth daily., Disp: 90 tablet, Rfl: 1   buPROPion (WELLBUTRIN XL) 150 MG 24 hr tablet, Take 1 tablet (150 mg total)  by mouth daily., Disp: 30 tablet, Rfl: 0   ergocalciferol (VITAMIN D2) 1.25 MG (50000 UT) capsule, Take 1 capsule (50,000 Units total) by mouth once a week., Disp: 4 capsule, Rfl: 3   hydrochlorothiazide (HYDRODIURIL) 12.5 MG tablet, Take 1 tablet (12.5 mg total) by mouth daily., Disp: 90 tablet, Rfl: 1   ibuprofen (ADVIL) 800 MG tablet, Take 1 tablet (800 mg total) by mouth every 8 (eight) hours as needed for mild pain or moderate pain. Take with food to prevent GI upset, Disp: 30 tablet, Rfl: 0   metoprolol (TOPROL XL) 200 MG 24 hr tablet, Take 1 tablet (200 mg total) by mouth daily., Disp: 90 tablet, Rfl: 1   norethindrone (ORTHO MICRONOR) 0.35 MG tablet, Take 1 tablet (0.35 mg total) by mouth daily., Disp: 84 tablet, Rfl:  3   valACYclovir (VALTREX) 1000 MG tablet, Take 2 pills twice a day for 1 day at first sign of cold sore, Disp: 20 tablet, Rfl: 1  Current Facility-Administered Medications:    ibuprofen (ADVIL) tablet 600 mg, 600 mg, Oral, Q6H PRN, Romualdo Bolk, MD, 600 mg at 06/27/22 1231   Review of Systems:   Review of Systems  Constitutional:  Negative for fever and malaise/fatigue.  HENT:  Negative for congestion.   Eyes:  Negative for blurred vision and double vision.  Respiratory:  Negative for cough and shortness of breath.   Cardiovascular:  Negative for chest pain, palpitations and leg swelling.  Gastrointestinal:  Negative for vomiting.  Musculoskeletal:  Negative for back pain.  Skin:  Negative for rash.  Neurological:  Positive for headaches. Negative for loss of consciousness.  Psychiatric/Behavioral:  Positive for depression.     Vitals:   Vitals:   07/31/22 1129  BP: 120/82  Pulse: 77  Temp: 97.7 F (36.5 C)  TempSrc: Temporal  SpO2: 97%  Weight: 211 lb (95.7 kg)  Height: 5\' 5"  (1.651 m)     Body mass index is 35.11 kg/m.  Physical Exam:   Physical Exam Vitals and nursing note reviewed.  Constitutional:      General: She is not in acute distress.    Appearance: She is well-developed. She is not ill-appearing or toxic-appearing.  HENT:     Head:     Comments: No significant TTP to b/l temples Cardiovascular:     Rate and Rhythm: Normal rate and regular rhythm.     Pulses: Normal pulses.     Heart sounds: Normal heart sounds, S1 normal and S2 normal.  Pulmonary:     Effort: Pulmonary effort is normal.     Breath sounds: Normal breath sounds.  Skin:    General: Skin is warm and dry.  Neurological:     General: No focal deficit present.     Mental Status: She is alert.     GCS: GCS eye subscore is 4. GCS verbal subscore is 5. GCS motor subscore is 6.     Cranial Nerves: Cranial nerves 2-12 are intact.     Sensory: Sensation is intact.     Motor: Motor  function is intact.     Coordination: Coordination is intact.     Gait: Gait is intact.  Psychiatric:        Speech: Speech normal.        Behavior: Behavior normal. Behavior is cooperative.     Assessment and Plan:   Frequent headaches Ongoing Slight improvement from yesterday No red flags warranting imaging at this time -- neurology exam is normal  Out of abundance of caution will obtain blood work including ESR/CRP - low suspicion for temporal arteritis - but there is some and we did review this I also reached out to hematologist Clent Jacks PA-C regarding patient who did confirm that headaches can be a result of this infusion. Recommend patient reach out to them regarding this especially at next infusion. Recommend trialing ibuprofen -- provided info on dosing on AVS If worsening headache(s), needs to go to the ER  Essential hypertension Normotensive Continue -- Taking amlodipine 10 mg daily, metoprolol 200 mg daily, hydrochlorothiazide 12.5 mg daily. Consider purchasing new monitor Follow-up with PCP if new/worsening concerns  Adjustment disorder with mixed anxiety and depressed mood Ongoing Denies SI/HI Recommend close follow-up with PCP Continue Wellbutrin 150 mg xl daily and talk therapy I discussed with patient that if they develop any SI, to tell someone immediately and seek medical attention.   I,Alexander Ruley,acting as a Neurosurgeon for [employer], PA.,have documented all relevant documentation on the behalf of Jarold Motto, PA,as directed by  Jarold Motto, PA while in the presence of Jarold Motto, [place].  I, Jarold Motto, [place], have reviewed all documentation for this visit. The documentation on 07/31/22 for the exam, diagnosis, procedures, and orders are all accurate and complete.  I spent a total of 53 minutes on this visit, today 07/31/22, which included reviewing previous notes from PCP on 05/24/22, ordering tests, discussing plan of care with patient  and using shared-decision making on next steps,communication with hematologist, Clent Jacks, and documenting the findings in the note.   Jarold Motto, PA-C

## 2022-07-31 NOTE — Telephone Encounter (Signed)
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 Also was having elevated BP Monday but has not checked it since then. She believes its elevated now due to the symptoms. Also has a headache on left side of face aligned with ear but higher. Translation No Nurse Assessment Nurse: Lily Kocher, RN, Adriana Date/Time (Eastern Time): 07/31/2022 9:52:05 AM Confirm and document reason for call. If symptomatic, describe symptoms. ---pt states overnight monday she started having a headache that is mostly to one side. started iron infusion on monday. pt is concerned her bp is elevated. has no way to take bp currently. pain to left temple, was 9/10 at 0300, 7/10 now. took metoprolol at 0300. usually takes this pill at 0900 or 1000. last took tylenol at 1000 Does the patient have any new or worsening symptoms? ---Yes Will a triage be completed? ---Yes Related visit to physician within the last 2 weeks? ---Yes Does the PT have any chronic conditions? (i.e. diabetes, asthma, this includes High risk factors for pregnancy, etc.) ---Yes List chronic conditions. ---htn iron deficiency depression Is the patient pregnant or possibly pregnant? (Ask all females between the ages of 58-55) ---No Is this a behavioral health or substance abuse call? ---No PLEASE NOTE:  All timestamps contained within this report are represented as [place] Standard Time. CONFIDENTIALTY NOTICE: This fax transmission is intended only for the addressee. It contains information that is legally privileged, confidential or otherwise protected from use or disclosure. If you are not the intended recipient, you are strictly prohibited from reviewing, disclosing, copying using or disseminating any of this information or taking any action in reliance on or regarding this information. If you have received this fax in error, please notify us immediately by telephone so that we can arrange for its return to [place]. Phone: [phone], Toll-Free: [phone], Fax: [phone] Page: 2 of 2 Call Id: [id number] Guidelines Guideline Title Affirmed Question Affirmed Notes Nurse Date/Time Lamount Cohen Time) Headache [1] MODERATE headache (e.g., interferes with normal activities) AND [2] present > 24 hours AND [3] unexplained (Exceptions: analgesics not tried, typical migraine, or headache part of viral illness) Lily Kocher, RN, Adriana 07/31/2022 9:58:25 AM Disp. Time Lamount Cohen Time) Disposition Final User 07/31/2022 10:01:57 AM See PCP within 24 Hours Yes Lily Kocher RN, Ricki Rodriguez Final Disposition 07/31/2022 10:01:57 AM See PCP within 24 Hours Yes Lily Kocher, RN, Ricki Rodriguez Caller Disagree/Comply Comply Caller Understands Yes PreDisposition Call Doctor Care Advice Given Per Guideline SEE PCP WITHIN 24 HOURS: * IF OFFICE WILL BE OPEN: You need to be examined within the next 24 hours. Call your doctor (or NP/PA) when the office opens and make an appointment. PAIN MEDICINES: * ACETAMINOPHEN - REGULAR STRENGTH TYLENOL: Take 650 mg (two 325 mg pills) by mouth every 4 to 6 hours as needed. Each Regular Strength Tylenol pill has 325 mg of acetaminophen. The most you should take is 10 pills a day (3,250 mg total). Note: In [place], the maximum is 12  pills a day (3,900 mg total). REST FOR HEADACHE: * Lie down in a dark quiet  place and relax until feeling better. COLD PACK FOR HEADACHE: * Put a cold pack or an ice bag (wrapped in a moist towel) on the forehead * Do this for 20 minutes. CALL BACK IF: * You become worse CARE ADVICE given per Headache (Adult) guideline. Referrals REFERRED TO PCP OFFICE

## 2022-07-31 NOTE — Telephone Encounter (Signed)
Awaiting triage note for patient 

## 2022-08-01 NOTE — Telephone Encounter (Signed)
error 

## 2022-08-01 NOTE — Telephone Encounter (Signed)
Noted. Pt had appt with Bufford Buttner.

## 2022-08-01 NOTE — Telephone Encounter (Signed)
Please see triage note and advise.  ?

## 2022-08-05 ENCOUNTER — Inpatient Hospital Stay: Payer: Commercial Managed Care - PPO

## 2022-08-05 VITALS — BP 116/70 | HR 77 | Temp 97.8°F | Resp 16

## 2022-08-05 DIAGNOSIS — N920 Excessive and frequent menstruation with regular cycle: Secondary | ICD-10-CM | POA: Diagnosis not present

## 2022-08-05 DIAGNOSIS — E611 Iron deficiency: Secondary | ICD-10-CM

## 2022-08-05 MED ORDER — ACETAMINOPHEN 325 MG PO TABS: 650.0000 mg | ORAL_TABLET | Freq: Once | ORAL | Status: DC

## 2022-08-05 MED ORDER — SODIUM CHLORIDE 0.9 % IV SOLN: Freq: Once | INTRAVENOUS | Status: AC

## 2022-08-05 MED ORDER — SODIUM CHLORIDE 0.9 % IV SOLN
300.0000 mg | Freq: Once | INTRAVENOUS | Status: AC
  Administered 2022-08-05: 300 mg via INTRAVENOUS
  Filled 2022-08-05: qty 300

## 2022-08-05 NOTE — Patient Instructions (Signed)

## 2022-08-12 ENCOUNTER — Inpatient Hospital Stay: Payer: Commercial Managed Care - PPO

## 2022-08-12 VITALS — BP 114/68 | HR 77 | Temp 98.0°F | Resp 18

## 2022-08-12 DIAGNOSIS — E611 Iron deficiency: Secondary | ICD-10-CM

## 2022-08-12 DIAGNOSIS — N920 Excessive and frequent menstruation with regular cycle: Secondary | ICD-10-CM | POA: Diagnosis not present

## 2022-08-12 MED ORDER — SODIUM CHLORIDE 0.9 % IV SOLN
300.0000 mg | Freq: Once | INTRAVENOUS | Status: AC
  Administered 2022-08-12: 300 mg via INTRAVENOUS
  Filled 2022-08-12: qty 300

## 2022-08-12 MED ORDER — SODIUM CHLORIDE 0.9 % IV SOLN: Freq: Once | INTRAVENOUS | Status: AC

## 2022-08-12 NOTE — Patient Instructions (Signed)

## 2022-08-12 NOTE — Progress Notes (Signed)
Patient refused to wait 30 minutes post infusion. Released stable and ASX. 

## 2023-01-13 ENCOUNTER — Encounter: Payer: Self-pay | Admitting: Hematology & Oncology

## 2023-01-17 ENCOUNTER — Other Ambulatory Visit: Payer: Self-pay

## 2023-01-17 DIAGNOSIS — D500 Iron deficiency anemia secondary to blood loss (chronic): Secondary | ICD-10-CM

## 2023-01-20 ENCOUNTER — Inpatient Hospital Stay: Payer: Medicaid Other | Admitting: Medical Oncology

## 2023-01-20 ENCOUNTER — Inpatient Hospital Stay: Payer: Medicaid Other | Attending: Physician Assistant

## 2024-04-01 IMAGING — DX DG TOE GREAT 2+V*R*
3 series · 3 of 3 positions shown · non-contrast
Comparison: None Available.

CLINICAL DATA: Stubbed toe

EXAM:
RIGHT GREAT TOE

[toe ap]
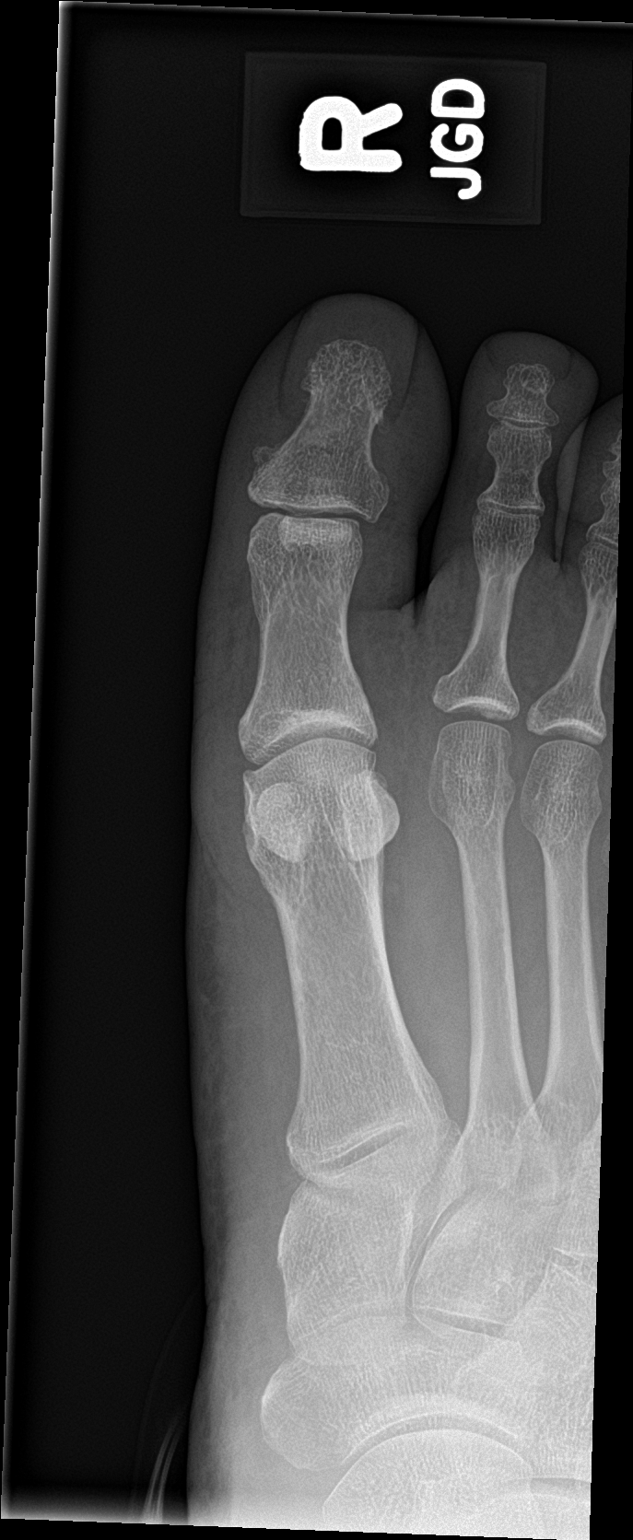

[toe obl]
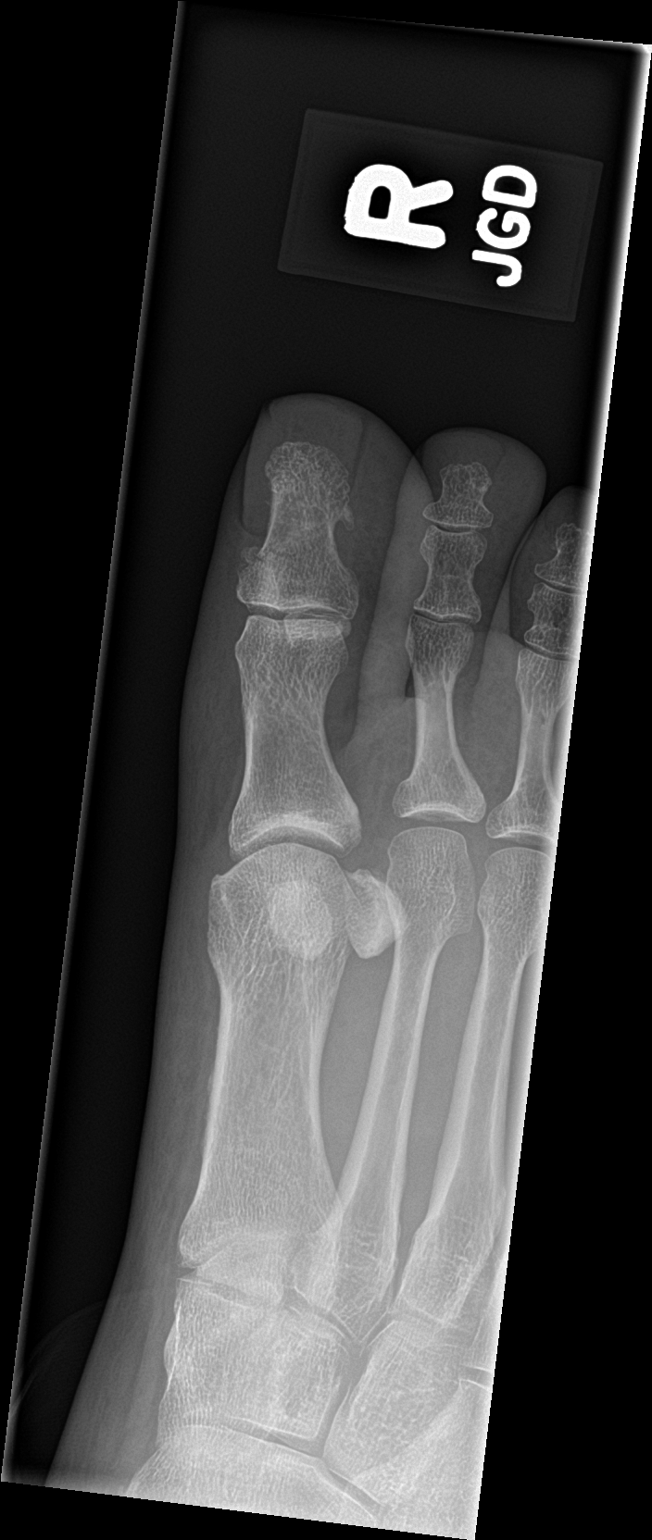

[toe lat]
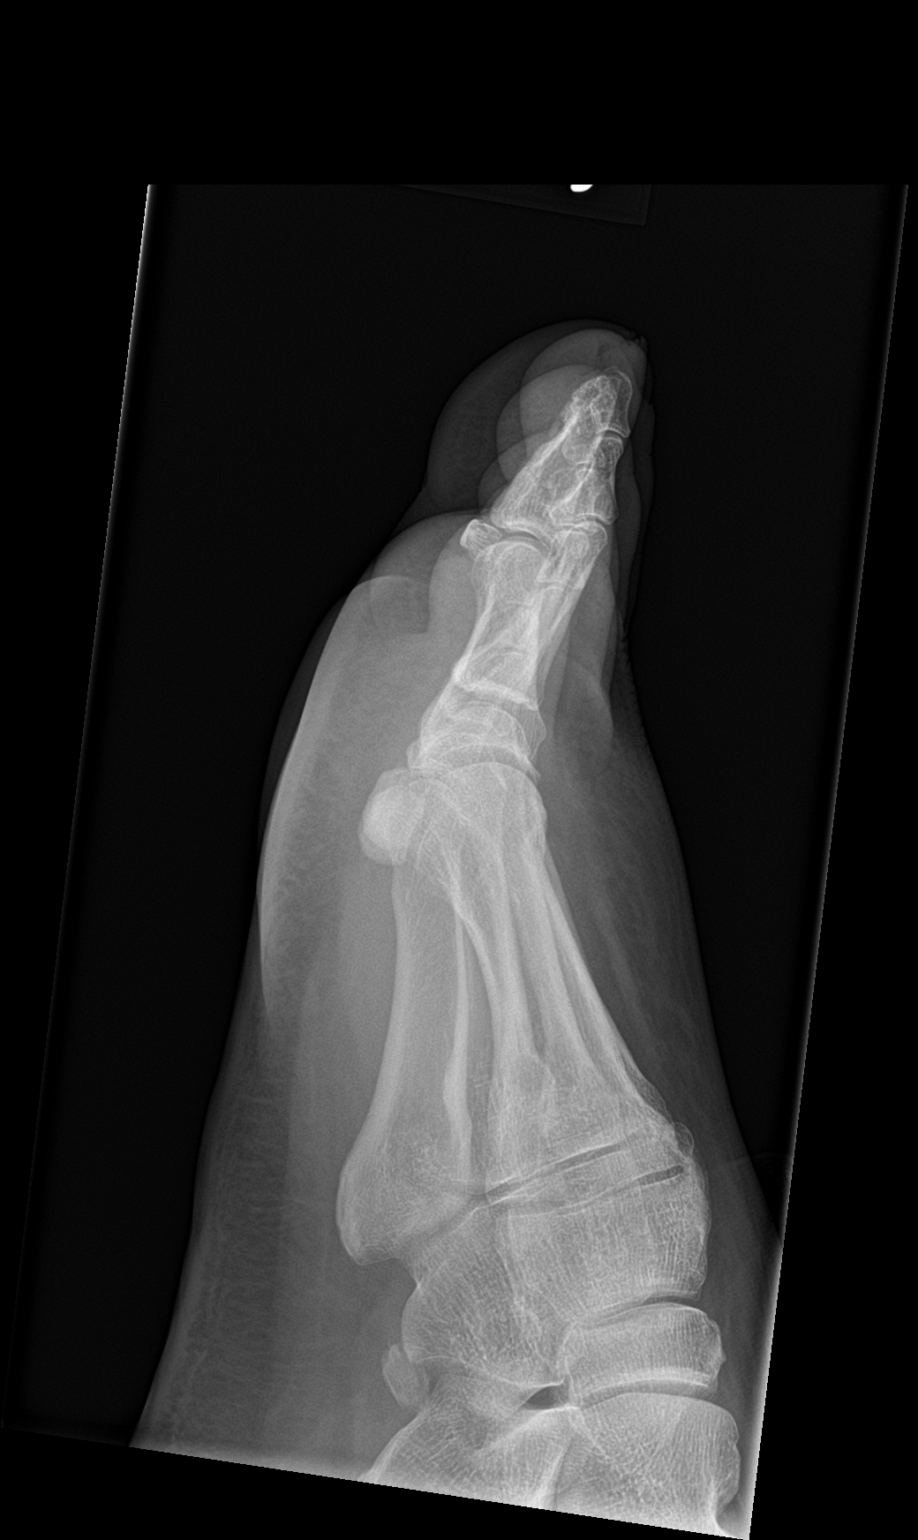

[3 of 3 positions shown; findings below may reference images not displayed]

FINDINGS: Lucency noted through the proximal aspect of the right great toe
distal phalanx compatible with nondisplaced fracture. No subluxation
or dislocation. Joint spaces maintained.
IMPRESSION: Nondisplaced fracture through the proximal aspect of the right great
toe distal phalanx.
